# Patient Record
Sex: Female | Born: 1935 | Race: White | Hispanic: No
Health system: Southern US, Community
[De-identification: ages and names within clinical notes are randomized; demographics above are authoritative.]

## PROBLEM LIST (undated history)

## (undated) DIAGNOSIS — E871 Hypo-osmolality and hyponatremia: Secondary | ICD-10-CM

## (undated) DIAGNOSIS — F39 Unspecified mood [affective] disorder: Secondary | ICD-10-CM

## (undated) DIAGNOSIS — I10 Essential (primary) hypertension: Secondary | ICD-10-CM

## (undated) DIAGNOSIS — E785 Hyperlipidemia, unspecified: Secondary | ICD-10-CM

## (undated) DIAGNOSIS — K219 Gastro-esophageal reflux disease without esophagitis: Secondary | ICD-10-CM

## (undated) DIAGNOSIS — G459 Transient cerebral ischemic attack, unspecified: Secondary | ICD-10-CM

## (undated) HISTORY — DX: Gastro-esophageal reflux disease without esophagitis: K21.9

## (undated) HISTORY — DX: Hypo-osmolality and hyponatremia: E87.1

## (undated) HISTORY — DX: Hyperlipidemia, unspecified: E78.5

## (undated) HISTORY — DX: Unspecified mood (affective) disorder: F39

## (undated) HISTORY — PX: ABDOMINAL HYSTERECTOMY: SHX81

---

## 2004-11-02 ENCOUNTER — Ambulatory Visit: Payer: Self-pay | Admitting: Internal Medicine

## 2005-12-18 ENCOUNTER — Ambulatory Visit: Payer: Self-pay | Admitting: Internal Medicine

## 2006-03-19 ENCOUNTER — Emergency Department: Payer: Self-pay | Admitting: Emergency Medicine

## 2006-03-19 ENCOUNTER — Other Ambulatory Visit: Payer: Self-pay

## 2006-12-23 ENCOUNTER — Ambulatory Visit: Payer: Self-pay | Admitting: Internal Medicine

## 2007-12-25 ENCOUNTER — Ambulatory Visit: Payer: Self-pay | Admitting: Internal Medicine

## 2008-12-27 ENCOUNTER — Ambulatory Visit: Payer: Self-pay | Admitting: Internal Medicine

## 2010-01-25 ENCOUNTER — Ambulatory Visit: Payer: Self-pay | Admitting: Internal Medicine

## 2011-01-26 ENCOUNTER — Ambulatory Visit: Payer: Self-pay | Admitting: Internal Medicine

## 2011-07-23 ENCOUNTER — Emergency Department: Payer: Self-pay | Admitting: Internal Medicine

## 2011-09-04 ENCOUNTER — Emergency Department: Payer: Self-pay | Admitting: Emergency Medicine

## 2012-01-28 ENCOUNTER — Ambulatory Visit: Payer: Self-pay | Admitting: Internal Medicine

## 2013-07-12 ENCOUNTER — Emergency Department: Payer: Self-pay | Admitting: Emergency Medicine

## 2013-07-12 LAB — COMPREHENSIVE METABOLIC PANEL
Alkaline Phosphatase: 62 U/L (ref 50–136)
Calcium, Total: 9.2 mg/dL (ref 8.5–10.1)
Chloride: 105 mmol/L (ref 98–107)
Co2: 28 mmol/L (ref 21–32)
EGFR (Non-African Amer.): >60
Glucose: 113 mg/dL — ABNORMAL HIGH (ref 65–99)
Osmolality: 278 (ref 275–301)
Sodium: 138 mmol/L (ref 136–145)

## 2013-07-12 LAB — CBC WITH DIFFERENTIAL/PLATELET
Basophil #: 0.1 x10 3/mm 3
Basophil %: 1 %
Eosinophil #: 0.1 x10 3/mm 3
Eosinophil %: 1.8 %
HCT: 35.3 %
HGB: 12 g/dL
Lymphocyte %: 39.9 %
Lymphs Abs: 3.2 x10 3/mm 3
MCH: 31.3 pg
MCHC: 34.1 g/dL
MCV: 92 fL
Monocyte #: 0.7 "x10 3/mm "
Monocyte %: 9 %
Neutrophil #: 3.9 x10 3/mm 3
Neutrophil %: 48.3 %
Platelet: 250 x10 3/mm 3
RBC: 3.84 X10 6/mm 3
RDW: 12.7 %
WBC: 8 x10 3/mm 3

## 2013-07-12 LAB — PRO B NATRIURETIC PEPTIDE: B-Type Natriuretic Peptide: 193 pg/mL

## 2013-07-12 LAB — TROPONIN I
Troponin-I: <0.02 ng/mL
Troponin-I: <0.02 ng/mL

## 2015-12-28 DIAGNOSIS — H903 Sensorineural hearing loss, bilateral: Secondary | ICD-10-CM | POA: Diagnosis not present

## 2016-01-23 DIAGNOSIS — B351 Tinea unguium: Secondary | ICD-10-CM | POA: Diagnosis not present

## 2016-01-23 DIAGNOSIS — M79675 Pain in left toe(s): Secondary | ICD-10-CM | POA: Diagnosis not present

## 2016-01-23 DIAGNOSIS — M79674 Pain in right toe(s): Secondary | ICD-10-CM | POA: Diagnosis not present

## 2016-03-01 DIAGNOSIS — Z79899 Other long term (current) drug therapy: Secondary | ICD-10-CM | POA: Diagnosis not present

## 2016-06-27 DIAGNOSIS — M79674 Pain in right toe(s): Secondary | ICD-10-CM | POA: Diagnosis not present

## 2016-06-27 DIAGNOSIS — B351 Tinea unguium: Secondary | ICD-10-CM | POA: Diagnosis not present

## 2016-06-27 DIAGNOSIS — M79675 Pain in left toe(s): Secondary | ICD-10-CM | POA: Diagnosis not present

## 2016-09-03 DIAGNOSIS — Z79899 Other long term (current) drug therapy: Secondary | ICD-10-CM | POA: Diagnosis not present

## 2016-09-03 DIAGNOSIS — N183 Chronic kidney disease, stage 3 (moderate): Secondary | ICD-10-CM | POA: Diagnosis not present

## 2016-09-10 DIAGNOSIS — I10 Essential (primary) hypertension: Secondary | ICD-10-CM | POA: Diagnosis not present

## 2016-09-10 DIAGNOSIS — E7800 Pure hypercholesterolemia, unspecified: Secondary | ICD-10-CM | POA: Diagnosis not present

## 2016-09-10 DIAGNOSIS — N183 Chronic kidney disease, stage 3 (moderate): Secondary | ICD-10-CM | POA: Diagnosis not present

## 2016-09-10 DIAGNOSIS — I679 Cerebrovascular disease, unspecified: Secondary | ICD-10-CM | POA: Diagnosis not present

## 2016-10-11 DIAGNOSIS — Z23 Encounter for immunization: Secondary | ICD-10-CM | POA: Diagnosis not present

## 2016-12-06 DIAGNOSIS — M79675 Pain in left toe(s): Secondary | ICD-10-CM | POA: Diagnosis not present

## 2016-12-06 DIAGNOSIS — M79674 Pain in right toe(s): Secondary | ICD-10-CM | POA: Diagnosis not present

## 2016-12-06 DIAGNOSIS — B351 Tinea unguium: Secondary | ICD-10-CM | POA: Diagnosis not present

## 2017-03-04 DIAGNOSIS — N183 Chronic kidney disease, stage 3 (moderate): Secondary | ICD-10-CM | POA: Diagnosis not present

## 2017-03-04 DIAGNOSIS — E7800 Pure hypercholesterolemia, unspecified: Secondary | ICD-10-CM | POA: Diagnosis not present

## 2017-03-11 DIAGNOSIS — L989 Disorder of the skin and subcutaneous tissue, unspecified: Secondary | ICD-10-CM | POA: Diagnosis not present

## 2017-03-11 DIAGNOSIS — Z79899 Other long term (current) drug therapy: Secondary | ICD-10-CM | POA: Diagnosis not present

## 2017-03-11 DIAGNOSIS — Z0000 Encounter for general adult medical examination without abnormal findings: Secondary | ICD-10-CM | POA: Diagnosis not present

## 2017-04-10 DIAGNOSIS — Z85828 Personal history of other malignant neoplasm of skin: Secondary | ICD-10-CM | POA: Diagnosis not present

## 2017-04-10 DIAGNOSIS — L578 Other skin changes due to chronic exposure to nonionizing radiation: Secondary | ICD-10-CM | POA: Diagnosis not present

## 2017-04-10 DIAGNOSIS — C44311 Basal cell carcinoma of skin of nose: Secondary | ICD-10-CM | POA: Diagnosis not present

## 2017-04-10 DIAGNOSIS — R040 Epistaxis: Secondary | ICD-10-CM | POA: Diagnosis not present

## 2017-04-10 DIAGNOSIS — D485 Neoplasm of uncertain behavior of skin: Secondary | ICD-10-CM | POA: Diagnosis not present

## 2017-07-11 DIAGNOSIS — Z85828 Personal history of other malignant neoplasm of skin: Secondary | ICD-10-CM | POA: Diagnosis not present

## 2017-07-11 DIAGNOSIS — L820 Inflamed seborrheic keratosis: Secondary | ICD-10-CM | POA: Diagnosis not present

## 2017-07-11 DIAGNOSIS — L578 Other skin changes due to chronic exposure to nonionizing radiation: Secondary | ICD-10-CM | POA: Diagnosis not present

## 2017-07-11 DIAGNOSIS — L821 Other seborrheic keratosis: Secondary | ICD-10-CM | POA: Diagnosis not present

## 2017-09-11 DIAGNOSIS — R2681 Unsteadiness on feet: Secondary | ICD-10-CM | POA: Diagnosis not present

## 2017-09-11 DIAGNOSIS — R5383 Other fatigue: Secondary | ICD-10-CM | POA: Diagnosis not present

## 2017-09-11 DIAGNOSIS — N3281 Overactive bladder: Secondary | ICD-10-CM | POA: Diagnosis not present

## 2017-09-11 DIAGNOSIS — R600 Localized edema: Secondary | ICD-10-CM | POA: Diagnosis not present

## 2017-09-11 DIAGNOSIS — R531 Weakness: Secondary | ICD-10-CM | POA: Diagnosis not present

## 2017-09-11 DIAGNOSIS — F329 Major depressive disorder, single episode, unspecified: Secondary | ICD-10-CM | POA: Diagnosis not present

## 2017-09-11 DIAGNOSIS — S40021A Contusion of right upper arm, initial encounter: Secondary | ICD-10-CM | POA: Diagnosis not present

## 2017-09-19 DIAGNOSIS — R2681 Unsteadiness on feet: Secondary | ICD-10-CM | POA: Diagnosis not present

## 2017-09-19 DIAGNOSIS — M6281 Muscle weakness (generalized): Secondary | ICD-10-CM | POA: Diagnosis not present

## 2017-09-24 DIAGNOSIS — M6281 Muscle weakness (generalized): Secondary | ICD-10-CM | POA: Diagnosis not present

## 2017-09-24 DIAGNOSIS — R2681 Unsteadiness on feet: Secondary | ICD-10-CM | POA: Diagnosis not present

## 2017-09-26 DIAGNOSIS — M6281 Muscle weakness (generalized): Secondary | ICD-10-CM | POA: Diagnosis not present

## 2017-09-26 DIAGNOSIS — R2681 Unsteadiness on feet: Secondary | ICD-10-CM | POA: Diagnosis not present

## 2017-09-27 DIAGNOSIS — M6281 Muscle weakness (generalized): Secondary | ICD-10-CM | POA: Diagnosis not present

## 2017-09-27 DIAGNOSIS — R2681 Unsteadiness on feet: Secondary | ICD-10-CM | POA: Diagnosis not present

## 2017-10-01 DIAGNOSIS — M6281 Muscle weakness (generalized): Secondary | ICD-10-CM | POA: Diagnosis not present

## 2017-10-01 DIAGNOSIS — R2681 Unsteadiness on feet: Secondary | ICD-10-CM | POA: Diagnosis not present

## 2017-10-03 DIAGNOSIS — R2681 Unsteadiness on feet: Secondary | ICD-10-CM | POA: Diagnosis not present

## 2017-10-03 DIAGNOSIS — M6281 Muscle weakness (generalized): Secondary | ICD-10-CM | POA: Diagnosis not present

## 2017-10-08 DIAGNOSIS — R2681 Unsteadiness on feet: Secondary | ICD-10-CM | POA: Diagnosis not present

## 2017-10-08 DIAGNOSIS — M6281 Muscle weakness (generalized): Secondary | ICD-10-CM | POA: Diagnosis not present

## 2017-10-09 DIAGNOSIS — B351 Tinea unguium: Secondary | ICD-10-CM | POA: Diagnosis not present

## 2017-10-09 DIAGNOSIS — M79675 Pain in left toe(s): Secondary | ICD-10-CM | POA: Diagnosis not present

## 2017-10-09 DIAGNOSIS — M79674 Pain in right toe(s): Secondary | ICD-10-CM | POA: Diagnosis not present

## 2017-10-10 DIAGNOSIS — R2681 Unsteadiness on feet: Secondary | ICD-10-CM | POA: Diagnosis not present

## 2017-10-10 DIAGNOSIS — M6281 Muscle weakness (generalized): Secondary | ICD-10-CM | POA: Diagnosis not present

## 2017-10-13 ENCOUNTER — Inpatient Hospital Stay
Admission: EM | Admit: 2017-10-13 | Discharge: 2017-10-16 | DRG: 645 | Disposition: A | Discharge status: Skilled Nursing Facility | Payer: Medicare Other | Attending: Internal Medicine | Admitting: Internal Medicine

## 2017-10-13 ENCOUNTER — Emergency Department: Payer: Medicare Other

## 2017-10-13 ENCOUNTER — Encounter: Payer: Self-pay | Admitting: Emergency Medicine

## 2017-10-13 DIAGNOSIS — R918 Other nonspecific abnormal finding of lung field: Secondary | ICD-10-CM | POA: Diagnosis not present

## 2017-10-13 DIAGNOSIS — R262 Difficulty in walking, not elsewhere classified: Secondary | ICD-10-CM | POA: Diagnosis not present

## 2017-10-13 DIAGNOSIS — R531 Weakness: Secondary | ICD-10-CM | POA: Diagnosis not present

## 2017-10-13 DIAGNOSIS — R1310 Dysphagia, unspecified: Secondary | ICD-10-CM | POA: Diagnosis present

## 2017-10-13 DIAGNOSIS — I10 Essential (primary) hypertension: Secondary | ICD-10-CM | POA: Diagnosis present

## 2017-10-13 DIAGNOSIS — R32 Unspecified urinary incontinence: Secondary | ICD-10-CM | POA: Diagnosis present

## 2017-10-13 DIAGNOSIS — Z8673 Personal history of transient ischemic attack (TIA), and cerebral infarction without residual deficits: Secondary | ICD-10-CM

## 2017-10-13 DIAGNOSIS — J449 Chronic obstructive pulmonary disease, unspecified: Secondary | ICD-10-CM | POA: Diagnosis not present

## 2017-10-13 DIAGNOSIS — E441 Mild protein-calorie malnutrition: Secondary | ICD-10-CM | POA: Diagnosis not present

## 2017-10-13 DIAGNOSIS — F419 Anxiety disorder, unspecified: Secondary | ICD-10-CM | POA: Diagnosis not present

## 2017-10-13 DIAGNOSIS — E222 Syndrome of inappropriate secretion of antidiuretic hormone: Principal | ICD-10-CM | POA: Diagnosis present

## 2017-10-13 DIAGNOSIS — Z741 Need for assistance with personal care: Secondary | ICD-10-CM | POA: Diagnosis not present

## 2017-10-13 DIAGNOSIS — Z66 Do not resuscitate: Secondary | ICD-10-CM | POA: Diagnosis present

## 2017-10-13 DIAGNOSIS — I6789 Other cerebrovascular disease: Secondary | ICD-10-CM | POA: Diagnosis not present

## 2017-10-13 DIAGNOSIS — F329 Major depressive disorder, single episode, unspecified: Secondary | ICD-10-CM | POA: Diagnosis not present

## 2017-10-13 DIAGNOSIS — Z7902 Long term (current) use of antithrombotics/antiplatelets: Secondary | ICD-10-CM

## 2017-10-13 DIAGNOSIS — M6281 Muscle weakness (generalized): Secondary | ICD-10-CM | POA: Diagnosis not present

## 2017-10-13 DIAGNOSIS — E871 Hypo-osmolality and hyponatremia: Secondary | ICD-10-CM | POA: Diagnosis not present

## 2017-10-13 DIAGNOSIS — R4182 Altered mental status, unspecified: Secondary | ICD-10-CM | POA: Diagnosis not present

## 2017-10-13 DIAGNOSIS — E860 Dehydration: Secondary | ICD-10-CM | POA: Diagnosis present

## 2017-10-13 DIAGNOSIS — Z7401 Bed confinement status: Secondary | ICD-10-CM | POA: Diagnosis not present

## 2017-10-13 HISTORY — DX: Essential (primary) hypertension: I10

## 2017-10-13 HISTORY — DX: Transient cerebral ischemic attack, unspecified: G45.9

## 2017-10-13 LAB — BASIC METABOLIC PANEL
Anion gap: 9 (ref 5–15)
BUN: 13 mg/dL (ref 6–20)
CHLORIDE: 91 mmol/L — AB (ref 101–111)
CO2: 26 mmol/L (ref 22–32)
CREATININE: 0.7 mg/dL (ref 0.44–1.00)
Calcium: 9.4 mg/dL (ref 8.9–10.3)
GFR calc non Af Amer: >60 mL/min (ref >60)
Glucose, Bld: 114 mg/dL — ABNORMAL HIGH (ref 65–99)
Potassium: 3.7 mmol/L (ref 3.5–5.1)
SODIUM: 126 mmol/L — AB (ref 135–145)

## 2017-10-13 LAB — URINALYSIS, COMPLETE (UACMP) WITH MICROSCOPIC
Bacteria, UA: NONE SEEN
Bilirubin Urine: NEGATIVE
GLUCOSE, UA: NEGATIVE mg/dL
Hgb urine dipstick: NEGATIVE
Ketones, ur: 5 mg/dL — AB
Leukocytes, UA: NEGATIVE
Nitrite: NEGATIVE
PH: 7 (ref 5.0–8.0)
Protein, ur: 30 mg/dL — AB
SPECIFIC GRAVITY, URINE: 1.005 (ref 1.005–1.030)
SQUAMOUS EPITHELIAL / LPF: NONE SEEN

## 2017-10-13 LAB — CBC
HCT: 35.6 % (ref 35.0–47.0)
Hemoglobin: 12.7 g/dL (ref 12.0–16.0)
MCH: 32.2 pg (ref 26.0–34.0)
MCHC: 35.6 g/dL (ref 32.0–36.0)
MCV: 90.3 fL (ref 80.0–100.0)
PLATELETS: 285 10*3/uL (ref 150–440)
RBC: 3.95 MIL/uL (ref 3.80–5.20)
RDW: 12.6 % (ref 11.5–14.5)
WBC: 7.1 10*3/uL (ref 3.6–11.0)

## 2017-10-13 LAB — TROPONIN I: Troponin I: <0.03 ng/mL (ref <0.03)

## 2017-10-13 LAB — LACTIC ACID, PLASMA: LACTIC ACID, VENOUS: 1 mmol/L (ref 0.5–1.9)

## 2017-10-13 MED ORDER — SODIUM CHLORIDE 0.9 % IV SOLN
INTRAVENOUS | Status: DC
  Administered 2017-10-13 – 2017-10-15 (×5): via INTRAVENOUS

## 2017-10-13 MED ORDER — CLONAZEPAM 0.5 MG PO TABS
0.2500 mg | ORAL_TABLET | Freq: Every day | ORAL | Status: DC
  Administered 2017-10-13 – 2017-10-15 (×3): 0.25 mg via ORAL
  Filled 2017-10-13 (×3): qty 1

## 2017-10-13 MED ORDER — SODIUM CHLORIDE 0.9 % IV BOLUS (SEPSIS)
500.0000 mL | Freq: Once | INTRAVENOUS | Status: AC
  Administered 2017-10-13: 500 mL via INTRAVENOUS

## 2017-10-13 MED ORDER — CITALOPRAM HYDROBROMIDE 20 MG PO TABS
10.0000 mg | ORAL_TABLET | Freq: Every day | ORAL | Status: DC
  Administered 2017-10-13 – 2017-10-16 (×4): 10 mg via ORAL
  Filled 2017-10-13 (×4): qty 1

## 2017-10-13 MED ORDER — METOPROLOL TARTRATE 25 MG PO TABS
25.0000 mg | ORAL_TABLET | Freq: Two times a day (BID) | ORAL | Status: DC
  Administered 2017-10-13 – 2017-10-16 (×7): 25 mg via ORAL
  Filled 2017-10-13 (×7): qty 1

## 2017-10-13 MED ORDER — LOSARTAN POTASSIUM 25 MG PO TABS
25.0000 mg | ORAL_TABLET | Freq: Every day | ORAL | Status: DC
  Administered 2017-10-13: 10:00:00 25 mg via ORAL
  Filled 2017-10-13: qty 1

## 2017-10-13 MED ORDER — ENOXAPARIN SODIUM 40 MG/0.4ML ~~LOC~~ SOLN
40.0000 mg | SUBCUTANEOUS | Status: DC
  Administered 2017-10-13 – 2017-10-15 (×3): 40 mg via SUBCUTANEOUS
  Filled 2017-10-13 (×3): qty 0.4

## 2017-10-13 MED ORDER — ONDANSETRON HCL 4 MG/2ML IJ SOLN: 4.0000 mg | Freq: Four times a day (QID) | INTRAMUSCULAR | Status: DC | PRN

## 2017-10-13 MED ORDER — CLOPIDOGREL BISULFATE 75 MG PO TABS
75.0000 mg | ORAL_TABLET | Freq: Every day | ORAL | Status: DC
  Administered 2017-10-13 – 2017-10-16 (×4): 75 mg via ORAL
  Filled 2017-10-13 (×4): qty 1

## 2017-10-13 MED ORDER — ACETAMINOPHEN 325 MG PO TABS: 650.0000 mg | ORAL_TABLET | Freq: Four times a day (QID) | ORAL | Status: DC | PRN

## 2017-10-13 MED ORDER — POLYETHYLENE GLYCOL 3350 17 G PO PACK
17.0000 g | PACK | Freq: Every day | ORAL | Status: DC | PRN
  Administered 2017-10-16: 13:00:00 17 g via ORAL
  Filled 2017-10-13: qty 1

## 2017-10-13 MED ORDER — ONDANSETRON HCL 4 MG PO TABS: 4.0000 mg | ORAL_TABLET | Freq: Four times a day (QID) | ORAL | Status: DC | PRN

## 2017-10-13 MED ORDER — VITAMIN D 1000 UNITS PO TABS
1000.0000 [IU] | ORAL_TABLET | Freq: Two times a day (BID) | ORAL | Status: DC
  Administered 2017-10-13 – 2017-10-16 (×7): 1000 [IU] via ORAL
  Filled 2017-10-13 (×12): qty 1

## 2017-10-13 MED ORDER — HYDRALAZINE HCL 20 MG/ML IJ SOLN
10.0000 mg | Freq: Four times a day (QID) | INTRAMUSCULAR | Status: DC | PRN
  Administered 2017-10-15 – 2017-10-16 (×2): 10 mg via INTRAVENOUS
  Filled 2017-10-13 (×3): qty 1

## 2017-10-13 MED ORDER — ACETAMINOPHEN 650 MG RE SUPP: 650.0000 mg | Freq: Four times a day (QID) | RECTAL | Status: DC | PRN

## 2017-10-13 NOTE — Progress Notes (Signed)
Patient has asked several times if she can have something tonight to help her sleep.  On call MD paged and made aware.  Verbal order for 0.25mg  klonopin QHS received.  Clarise Cruz, RN

## 2017-10-13 NOTE — H&P (Signed)
[facility] [place] NAME: Debra Ho    MR#:  [id number]  DATE OF BIRTH:  1915/08/03  DATE OF ADMISSION:  10/13/2017  PRIMARY CARE PHYSICIAN: Derinda Late, MD   REQUESTING/REFERRING PHYSICIAN: 10/13/2017  CHIEF COMPLAINT:  Generalized weakness  HISTORY OF PRESENT ILLNESS:  Debra Ho  is a 83 y.o. female with a known history of hypertension comes from [facility] with increasing weakness for last few days.  Patient states she has had poor appetite.  She denies any nausea vomiting diarrhea fever or chills.  In the emergency room she was found to have sodium of 126 chloride of 91.  She clinically appears dehydrated weak and fatigued.  She is being admitted for further evaluation and management of acute hyponatremia.  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  PAST MEDICAL HISTORY:   Past Medical History:  Diagnosis Date  . Hypertension   . TIA (transient ischemic attack)     PAST SURGICAL HISTOIRY:  History reviewed. No pertinent surgical history.  SOCIAL HISTORY:   Social History  Substance Use Topics  . Smoking status: Passive Smoke Exposure - Never Smoker  . Smokeless tobacco: Never Used  . Alcohol use Not on file    FAMILY HISTORY:  History reviewed. No pertinent family history.  DRUG ALLERGIES:   Allergies  Allergen Reactions  . Penicillins   . Sulfa Antibiotics     REVIEW OF SYSTEMS:  Review of Systems  Constitutional: Negative for chills, fever and weight loss.  HENT: Negative for ear discharge, ear pain and nosebleeds.   Eyes: Negative for blurred vision, pain and discharge.  Respiratory: Negative for sputum production, shortness of breath, wheezing and stridor.   Cardiovascular: Negative for chest pain, palpitations, orthopnea and PND.  Gastrointestinal: Negative for abdominal pain, diarrhea, nausea and vomiting.  Genitourinary: Negative for frequency and  urgency.  Musculoskeletal: Negative for back pain and joint pain.  Neurological: Positive for weakness. Negative for sensory change, speech change and focal weakness.  Psychiatric/Behavioral: Negative for depression and hallucinations. The patient is not nervous/anxious.      MEDICATIONS AT HOME:   Prior to Admission medications   Medication Sig Start Date End Date Taking? Authorizing Provider  citalopram (CELEXA) 10 MG tablet Take 1 tablet by mouth daily. 10/07/17 10/07/18 Yes [provider]  clopidogrel (PLAVIX) 75 MG tablet Take 1 tablet by mouth daily. 09/10/17  Yes [provider]  losartan (COZAAR) 25 MG tablet Take 1 tablet by mouth daily. 09/10/17  Yes [provider]  Cholecalciferol (VITAMIN D3) 1000 units CAPS Take 1 tablet by mouth 2 (two) times daily.    [provider]      VITAL SIGNS:  Blood pressure (!) 209/58, pulse 73, temperature 97.8 F (36.6 C), temperature source Oral, resp. rate (!) 22, height 5\' 1"  (1.549 m), weight 65.8 kg (145 lb), SpO2 96 %.  PHYSICAL EXAMINATION:  GENERAL:  83 y.o.-year-old patient lying in the bed with no acute distress.  EYES: Pupils equal, round, reactive to light and accommodation. No scleral icterus. Extraocular muscles intact.  HEENT: Head atraumatic, normocephalic.  Oral mucosa dry  NECK:  Supple, no jugular venous distention. No thyroid enlargement, no tenderness.  LUNGS: Normal breath sounds bilaterally, no wheezing, rales,rhonchi or crepitation. No use of accessory muscles of respiration.  CARDIOVASCULAR: S1, S2 normal. No murmurs, rubs, or gallops.  ABDOMEN: Soft, nontender, nondistended. Bowel sounds present. No organomegaly or mass.  EXTREMITIES:  No pedal edema, cyanosis, or clubbing.  NEUROLOGIC: Cranial nerves II through XII are intact. Muscle strength 4/5 in all extremities. Sensation intact. Gait not checked.  PSYCHIATRIC: The patient is alert and oriented x 3.  SKIN: No obvious rash,  lesion, or ulcer.   LABORATORY PANEL:   CBC  Recent Labs Lab 10/13/17 0445  WBC 7.1  HGB 12.7  HCT 35.6  PLT 285   ------------------------------------------------------------------------------------------------------------------  Chemistries   Recent Labs Lab 10/13/17 0445  NA 126*  K 3.7  CL 91*  CO2 26  GLUCOSE 114*  BUN 13  CREATININE 0.70  CALCIUM 9.4   ------------------------------------------------------------------------------------------------------------------  Cardiac Enzymes  Recent Labs Lab 10/13/17 0445  TROPONINI <0.03   ------------------------------------------------------------------------------------------------------------------  RADIOLOGY:  Dg Chest 2 View  Result Date: 10/13/2017 CLINICAL DATA:  Acute onset of generalized weakness and loss of appetite. EXAM: CHEST  2 VIEW COMPARISON:  Chest radiograph performed 07/12/2013 FINDINGS: The lungs are hyperexpanded, with flattening of the hemidiaphragms, compatible with COPD. Peribronchial thickening is noted. Minimal bilateral atelectasis or scarring is noted. There is no evidence of pleural effusion or pneumothorax. The heart is mildly enlarged. No acute osseous abnormalities are seen. IMPRESSION: 1. Findings of COPD. Minimal bilateral atelectasis or scarring noted. 2. Mild cardiomegaly. Electronically Signed   By: Garald Balding M.D.   On: 10/13/2017 07:08   Ct Head Wo Contrast  Result Date: 10/13/2017 CLINICAL DATA:  Weakness, decreased appetite for 3 days. EXAM: CT HEAD WITHOUT CONTRAST TECHNIQUE: Contiguous axial images were obtained from the base of the skull through the vertex without intravenous contrast. COMPARISON:  CT HEAD September 04, 2011 FINDINGS: BRAIN: No intraparenchymal hemorrhage, mass effect nor midline shift. The ventricles and sulci are normal for age. Patchy to confluent supratentorial white matter hypodensities. Old RIGHT basal ganglia lacunar infarct though, new from prior  imaging. No acute large vascular territory infarcts. No abnormal extra-axial fluid collections. Basal cisterns are patent. VASCULAR: Moderate calcific atherosclerosis of the carotid siphons. SKULL: No skull fracture. Severe temporomandibular osteoarthrosis. No significant scalp soft tissue swelling. SINUSES/ORBITS: The mastoid air-cells and included paranasal sinuses are well-aerated.The included ocular globes and orbital contents are non-suspicious. Status post bilateral ocular lens implants. OTHER: None. IMPRESSION: 1. No acute intracranial process. 2. Moderate to severe chronic small vessel ischemic disease. Old RIGHT basal ganglia lacunar infarct. Electronically Signed   By: Elon Alas M.D.   On: 10/13/2017 06:55    EKG:    IMPRESSION AND PLAN:   Debra Ho  is a 83 y.o. female with a known history of hypertension comes from [facility] with increasing weakness for last few days.   1 acute hyponatremia with dehydration secondary to poor p.o. Intake IV fluids- -came in with sodium of 126 Patient encouraged to eat  2.  Malignant hypertension -Continue home meds PRN hydralazine-  3.  Generalized weakness -PT to see patient  4.  DVT prophylaxis subcu Lovenox   All the records are reviewed and case discussed with ED provider. Management plans discussed with the patient, family and they are in agreement.  CODE STATUS: DNR--discussed with patient  TOTAL TIME TAKING CARE OF THIS PATIENT: *45* minutes.    Kolden Dupee M.D on 10/13/2017 at 12:18 PM  Between 7am to 6pm - Pager - [phone]  After 6pm go to [email or web address] - password [email or web address] [facility]  [facility]  Office  [phone]  CC: Primary care physician; Derinda Late, MD

## 2017-10-13 NOTE — ED Provider Notes (Signed)
[facility] Emergency Department Provider Note   ____________________________________________   First MD Initiated Contact with Patient 10/13/17 [phone]     (approximate)  I have reviewed the triage vital signs and the nursing notes.   HISTORY  Chief Complaint Weakness    HPI Debra Ho is a 83 y.o. female who comes into the hospital today stating that she has not been doing well for the past 3-4 days.  The patient is independent and reports that she cannot seem to care for herself.  She has been having a hard time walking and she has really bad dry mouth.  She also states that she has been unable to eat anything solid.  She reports that he just has a Vascular throat.  The patient has been trying to push it down but it is very uncomfortable.  She states that she is incontinent but still gets up multiple times nightly and because she cannot walk it makes it very difficult.  The patient denies any fevers, chest pain, dizziness.  She reports that she has had some occasional shortness of breath but nothing significant.  The patient denies abdominal pain but does have some nausea.  She has had no vomiting.  The patient reports that 3 weeks ago she was perfectly fine and able to care for herself but at this point she is unable to do so.  The patient came into the hospital today for further evaluation of her symptoms.   Past Medical History:  Diagnosis Date  . Hypertension   . TIA (transient ischemic attack)     Patient Active Problem List   Diagnosis Date Noted  . Hyponatremia 10/13/2017    History reviewed. No pertinent surgical history.  Prior to Admission medications   Medication Sig Start Date End Date Taking? Authorizing Provider  citalopram (CELEXA) 10 MG tablet Take 1 tablet by mouth daily. 10/07/17 10/07/18 Yes [provider]  clopidogrel (PLAVIX) 75 MG tablet Take 1 tablet by mouth daily. 09/10/17  Yes [provider]    losartan (COZAAR) 25 MG tablet Take 1 tablet by mouth daily. 09/10/17  Yes [provider]  Cholecalciferol (VITAMIN D3) 1000 units CAPS Take 1 tablet by mouth 2 (two) times daily.    [provider]    Allergies Penicillins and Sulfa antibiotics  No family history on file.  Social History Social History  Substance Use Topics  . Smoking status: Passive Smoke Exposure - Never Smoker  . Smokeless tobacco: Never Used  . Alcohol use Not on file    Review of Systems  Constitutional: No fever/chills Eyes: No visual changes. ENT: No sore throat. Cardiovascular: Denies chest pain. Respiratory:  shortness of breath. Gastrointestinal: No abdominal pain.  No nausea, no vomiting.  No diarrhea.  No constipation. Genitourinary: Negative for dysuria. Musculoskeletal: Negative for back pain. Skin: Negative for rash. Neurological: weakness, inability to walk.   ____________________________________________   PHYSICAL EXAM:  VITAL SIGNS: ED Triage Vitals  Enc Vitals Group     BP 10/13/17 0430 (!) 222/81     Pulse Rate 10/13/17 0430 83     Resp 10/13/17 0431 18     Temp 10/13/17 0431 98.1 F (36.7 C)     Temp Source 10/13/17 0431 Oral     SpO2 10/13/17 0430 97 %     Weight 10/13/17 0431 145 lb (65.8 kg)     Height 10/13/17 0431 5\' 1"  (1.549 m)     Head Circumference --  Peak Flow --      Pain Score 10/13/17 0429 8     Pain Loc --      Pain Edu? --      Excl. in [place]? --     Constitutional: Alert and oriented. Well appearing and in mild distress. Eyes: Conjunctivae are normal. PERRL. EOMI. Head: Atraumatic. Nose: No congestion/rhinnorhea. Mouth/Throat: Mucous membranes are moist.  Oropharynx non-erythematous. Cardiovascular: Normal rate, regular rhythm. Grossly normal heart sounds.  Good peripheral circulation. Respiratory: Normal respiratory effort.  No retractions. Lungs CTAB. Gastrointestinal: Soft and nontender. No distention. Positive bowel  sounds Musculoskeletal: No lower extremity tenderness nor edema.   Neurologic:  Halting/ stuttering speech and language.  Cranial nerves II through XII are grossly intact with no focal motor neuro deficits, mild left-sided weakness to extension and flexion.  Bilateral lower extremity weakness about a 4+ out of 5. Skin:  Skin is warm, dry and intact.  Psychiatric: Mood and affect are normal.   ____________________________________________   LABS (all labs ordered are listed, but only abnormal results are displayed)  Labs Reviewed  BASIC METABOLIC PANEL - Abnormal; Notable for the following:       Result Value   Sodium 126 (*)    Chloride 91 (*)    Glucose, Bld 114 (*)    All other components within normal limits  URINALYSIS, COMPLETE (UACMP) WITH MICROSCOPIC - Abnormal; Notable for the following:    Color, Urine STRAW (*)    APPearance CLEAR (*)    Ketones, ur 5 (*)    Protein, ur 30 (*)    All other components within normal limits  CBC  TROPONIN I  LACTIC ACID, PLASMA  CBG MONITORING, ED   ____________________________________________  EKG  ED ECG REPORT I, Loney Hering, the attending physician, personally viewed and interpreted this ECG.   Date: 10/13/2017  EKG Time: 432  Rate: 82  Rhythm: normal sinus rhythm  Axis: left axis deviation  Intervals:none  ST&T Change: flipped t waves in lead I and avL  ____________________________________________  RADIOLOGY  Dg Chest 2 View  Result Date: 10/13/2017 CLINICAL DATA:  Acute onset of generalized weakness and loss of appetite. EXAM: CHEST  2 VIEW COMPARISON:  Chest radiograph performed 07/12/2013 FINDINGS: The lungs are hyperexpanded, with flattening of the hemidiaphragms, compatible with COPD. Peribronchial thickening is noted. Minimal bilateral atelectasis or scarring is noted. There is no evidence of pleural effusion or pneumothorax. The heart is mildly enlarged. No acute osseous abnormalities are seen. IMPRESSION:  1. Findings of COPD. Minimal bilateral atelectasis or scarring noted. 2. Mild cardiomegaly. Electronically Signed   By: Garald Balding M.D.   On: 10/13/2017 07:08   Ct Head Wo Contrast  Result Date: 10/13/2017 CLINICAL DATA:  Weakness, decreased appetite for 3 days. EXAM: CT HEAD WITHOUT CONTRAST TECHNIQUE: Contiguous axial images were obtained from the base of the skull through the vertex without intravenous contrast. COMPARISON:  CT HEAD September 04, 2011 FINDINGS: BRAIN: No intraparenchymal hemorrhage, mass effect nor midline shift. The ventricles and sulci are normal for age. Patchy to confluent supratentorial white matter hypodensities. Old RIGHT basal ganglia lacunar infarct though, new from prior imaging. No acute large vascular territory infarcts. No abnormal extra-axial fluid collections. Basal cisterns are patent. VASCULAR: Moderate calcific atherosclerosis of the carotid siphons. SKULL: No skull fracture. Severe temporomandibular osteoarthrosis. No significant scalp soft tissue swelling. SINUSES/ORBITS: The mastoid air-cells and included paranasal sinuses are well-aerated.The included ocular globes and orbital contents are non-suspicious. Status post bilateral  ocular lens implants. OTHER: None. IMPRESSION: 1. No acute intracranial process. 2. Moderate to severe chronic small vessel ischemic disease. Old RIGHT basal ganglia lacunar infarct. Electronically Signed   By: Elon Alas M.D.   On: 10/13/2017 06:55    ____________________________________________   PROCEDURES  Procedure(s) performed: None  Procedures  Critical Care performed: No  ____________________________________________   INITIAL IMPRESSION / ASSESSMENT AND PLAN / ED COURSE  As part of my medical decision making, I reviewed the following data within the electronic MEDICAL RECORD NUMBER Notes from prior ED visits and [place] Controlled Substance Database   This is 83 year old female who comes into the hospital today  with some weakness and feeling unwell for the past 3 days.  The patient typically lives in independent living and cares for herself.  My differential diagnosis includes urinary tract infection, pneumonia, non-ST segment elevation MI, stroke.  I did send some blood work on the patient and it was found that the patient was hyponatremic with a sodium of 126.  The patient's urinalysis was unremarkable as well as her CBC.  The patient had a lactic acid that was also negative.  We did perform a chest x-ray which did not show any signs of pneumonia and she did have a CT scan.  The patient CT scan showed no acute intracranial process and old right basal ganglia lacunar infarct.  Given the patient's symptoms as well as her elevated blood pressure I will admitted to the hospitalist service for further evaluation and treatment of her symptoms.  It is also possible that the patient's symptoms are blood pressure induced.  She will be admitted to the hospitalist service.      ____________________________________________   FINAL CLINICAL IMPRESSION(S) / ED DIAGNOSES  Final diagnoses:  Weakness  Hyponatremia      NEW MEDICATIONS STARTED DURING THIS VISIT:  New Prescriptions   No medications on file     Note:  This document was prepared using Dragon voice recognition software and may include unintentional dictation errors.    Loney Hering, MD 10/13/17 [phone]

## 2017-10-13 NOTE — ED Notes (Signed)
Pt given apple sauce

## 2017-10-13 NOTE — Progress Notes (Signed)
PT Cancellation Note  Patient Details Name: Debra Ho MRN: [id number] DOB: Dec 01, 1915   Cancelled Treatment:    Reason Eval/Treat Not Completed: Medical issues which prohibited therapy. After performing thorough chart review, patient's BP noted to be quite elevated. Spoke with RN who agreed that it is best to hold mobility assessment at this time and check back next date when medically ready.   Dorice Lamas, PT, DPT 10/13/2017, 11:46 AM

## 2017-10-13 NOTE — ED Notes (Signed)
Talked to pts daughter. Updated on status

## 2017-10-13 NOTE — ED Triage Notes (Addendum)
Patient brought in by ems from [facility]. Patient with complaint of weakness and decrease appetite times 3 days. fsbs by ems 117.

## 2017-10-13 NOTE — Progress Notes (Signed)
Patient's paperwork from [facility] show that patient is a DNR.  Spoke with patient and she confirmed that she is in fact a DNR.  Paged Dr. Posey Pronto and awaiting call back.  Clarise Cruz, RN

## 2017-10-13 NOTE — ED Notes (Signed)
Pt reports decreased appetite and feeling weak for 3 days.

## 2017-10-14 LAB — BASIC METABOLIC PANEL
Anion gap: 4 — ABNORMAL LOW (ref 5–15)
BUN: 12 mg/dL (ref 6–20)
CHLORIDE: 99 mmol/L — AB (ref 101–111)
CO2: 24 mmol/L (ref 22–32)
Calcium: 8.2 mg/dL — ABNORMAL LOW (ref 8.9–10.3)
Creatinine, Ser: 0.78 mg/dL (ref 0.44–1.00)
GFR calc Af Amer: >60 mL/min (ref >60)
Glucose, Bld: 90 mg/dL (ref 65–99)
POTASSIUM: 3.8 mmol/L (ref 3.5–5.1)
SODIUM: 127 mmol/L — AB (ref 135–145)

## 2017-10-14 LAB — SODIUM: SODIUM: 125 mmol/L — AB (ref 135–145)

## 2017-10-14 MED ORDER — LOSARTAN POTASSIUM 50 MG PO TABS
50.0000 mg | ORAL_TABLET | Freq: Every day | ORAL | Status: DC
  Administered 2017-10-14 – 2017-10-16 (×3): 50 mg via ORAL
  Filled 2017-10-14 (×3): qty 1

## 2017-10-14 NOTE — Progress Notes (Signed)
PT Cancellation Note  Patient Details Name: SHARA HARTIS MRN: [id number] DOB: 01-06-1915   Cancelled Treatment:    Reason Eval/Treat Not Completed: Other (comment). Evaluation attempted, however pt eating breakfast and very concerned about her nutrition prior to ambulation. Told patient, I would re-attempt.   Corlene Sabia 10/14/2017, 9:09 AM  Greggory Stallion, PT, DPT [phone]

## 2017-10-14 NOTE — Progress Notes (Signed)
Spoke with son, Gershon Mussel. Concerned for need of skilled nursing facility/ higher level of care preferably  with [facility]

## 2017-10-14 NOTE — Progress Notes (Signed)
[facility] NAME: Debra Ho    MR#:  [id number]  DATE OF BIRTH:  Feb 18, 1915  SUBJECTIVE:   Having issues with swallowing. Wants to get out of bed REVIEW OF SYSTEMS:   Review of Systems  Constitutional: Negative for chills, fever and weight loss.  HENT: Negative for ear discharge, ear pain and nosebleeds.   Eyes: Negative for blurred vision, pain and discharge.  Respiratory: Negative for sputum production, shortness of breath, wheezing and stridor.   Cardiovascular: Negative for chest pain, palpitations, orthopnea and PND.  Gastrointestinal: Negative for abdominal pain, diarrhea, nausea and vomiting.  Genitourinary: Negative for frequency and urgency.  Musculoskeletal: Negative for back pain and joint pain.  Neurological: Positive for weakness. Negative for sensory change, speech change and focal weakness.  Psychiatric/Behavioral: Negative for depression and hallucinations. The patient is not nervous/anxious.    Tolerating Diet:yes Tolerating PT: pending  DRUG ALLERGIES:   Allergies  Allergen Reactions  . Penicillins   . Sulfa Antibiotics     VITALS:  Blood pressure (!) 185/55, pulse 81, temperature 98.9 F (37.2 C), temperature source Oral, resp. rate 20, height 5\' 1"  (1.549 m), weight 65.8 kg (145 lb), SpO2 95 %.  PHYSICAL EXAMINATION:   Physical Exam  GENERAL:  83 y.o.-year-old patient lying in the bed with no acute distress.  EYES: Pupils equal, round, reactive to light and accommodation. No scleral icterus. Extraocular muscles intact.  HEENT: Head atraumatic, normocephalic. Oropharynx and nasopharynx clear.  NECK:  Supple, no jugular venous distention. No thyroid enlargement, no tenderness.  LUNGS: Normal breath sounds bilaterally, no wheezing, rales, rhonchi. No use of accessory muscles of respiration.  CARDIOVASCULAR: S1, S2 normal. No murmurs, rubs, or gallops.  ABDOMEN: Soft, nontender,  nondistended. Bowel sounds present. No organomegaly or mass.  EXTREMITIES: No cyanosis, clubbing or edema b/l.    NEUROLOGIC: Cranial nerves II through XII are intact. No focal Motor or sensory deficits b/l.   PSYCHIATRIC:  patient is alert and oriented x 3.  SKIN: No obvious rash, lesion, or ulcer.   LABORATORY PANEL:  CBC  Recent Labs Lab 10/13/17 0445  WBC 7.1  HGB 12.7  HCT 35.6  PLT 285    Chemistries   Recent Labs Lab 10/14/17 0438  NA 127*  K 3.8  CL 99*  CO2 24  GLUCOSE 90  BUN 12  CREATININE 0.78  CALCIUM 8.2*   Cardiac Enzymes  Recent Labs Lab 10/13/17 0445  TROPONINI <0.03   RADIOLOGY:  Dg Chest 2 View  Result Date: 10/13/2017 CLINICAL DATA:  Acute onset of generalized weakness and loss of appetite. EXAM: CHEST  2 VIEW COMPARISON:  Chest radiograph performed 07/12/2013 FINDINGS: The lungs are hyperexpanded, with flattening of the hemidiaphragms, compatible with COPD. Peribronchial thickening is noted. Minimal bilateral atelectasis or scarring is noted. There is no evidence of pleural effusion or pneumothorax. The heart is mildly enlarged. No acute osseous abnormalities are seen. IMPRESSION: 1. Findings of COPD. Minimal bilateral atelectasis or scarring noted. 2. Mild cardiomegaly. Electronically Signed   By: Garald Balding M.D.   On: 10/13/2017 07:08   Ct Head Wo Contrast  Result Date: 10/13/2017 CLINICAL DATA:  Weakness, decreased appetite for 3 days. EXAM: CT HEAD WITHOUT CONTRAST TECHNIQUE: Contiguous axial images were obtained from the base of the skull through the vertex without intravenous contrast. COMPARISON:  CT HEAD September 04, 2011 FINDINGS: BRAIN: No intraparenchymal hemorrhage, mass effect nor midline shift. The ventricles and sulci  are normal for age. Patchy to confluent supratentorial white matter hypodensities. Old RIGHT basal ganglia lacunar infarct though, new from prior imaging. No acute large vascular territory infarcts. No abnormal  extra-axial fluid collections. Basal cisterns are patent. VASCULAR: Moderate calcific atherosclerosis of the carotid siphons. SKULL: No skull fracture. Severe temporomandibular osteoarthrosis. No significant scalp soft tissue swelling. SINUSES/ORBITS: The mastoid air-cells and included paranasal sinuses are well-aerated.The included ocular globes and orbital contents are non-suspicious. Status post bilateral ocular lens implants. OTHER: None. IMPRESSION: 1. No acute intracranial process. 2. Moderate to severe chronic small vessel ischemic disease. Old RIGHT basal ganglia lacunar infarct. Electronically Signed   By: Elon Alas M.D.   On: 10/13/2017 06:55   ASSESSMENT AND PLAN:  Debra Ho  is a 83 y.o. female with a known history of hypertension comes from [facility] [place] [facility] with increasing weakness for last few days.   1 acute hyponatremia with dehydration secondary to poor p.o. Intake IV fluids- -came in with sodium of 126--127 Patient encouraged to eat  2.  Malignant hypertension -Continue home meds PRN hydralazine- -increased losartan  3.  Generalized weakness -PT to see patient  4.  DVT prophylaxis subcu Lovenox  5. Dysphagia-mild Speech to see  D/w pt's son on the phone  Case discussed with Care Management/Social Worker. Management plans discussed with the patient, family and they are in agreement.  CODE STATUS: dnr DVT Prophylaxis: lovenox  TOTAL TIME TAKING CARE OF THIS PATIENT: *30* minutes.  >50% time spent on counselling and coordination of care  POSSIBLE D/C IN *1-2 DAYS, DEPENDING ON CLINICAL CONDITION.  Note: This dictation was prepared with Dragon dictation along with smaller phrase technology. Any transcriptional errors that result from this process are unintentional.  Beonka Amesquita M.D on 10/14/2017 at 8:39 AM  Between 7am to 6pm - Pager - [phone]  After 6pm go to [email or web address] - password [email or web address] [facility]   Office  [phone]  CC: Primary care physician; Derinda Late, MD

## 2017-10-14 NOTE — Progress Notes (Signed)
Spoke with patient Son and daughter via telephone Helene Kelp and Herbie [place]). Updated on plan of care. Son and Daughter concerned need a higher level of care than currently receiving. Explained physical therapy is schedule to assess patient today.

## 2017-10-14 NOTE — Evaluation (Signed)
Occupational Therapy Evaluation Patient Details Name: Debra Ho MRN: [id number] DOB: October 23, 1915 Today's Date: 10/14/2017    History of Present Illness Pt. is a 83 y.o. female who was admitted to [facility] with hyponatremia. Pt. PMHx includes: HTN, and TIA.   Clinical Impression   Pt. Is a 83 y.o. Female who was admitted to [facility] with hyponatremia. Pt. Presents with weakness, limited activity tolerance, and limited functional mobility. Pt. Resides at [facility] in independent living. Pt. Was very independent up until one month ago, when she started to have a progressive decline requiring assist with ADLs, IADL, and walking. Pt. Has a personal care giver once a day to assist pt. With ADLs/IADLs. Pt. Could benefit from skilled OT services for ADL training, A/E training, functional mobility, and pt. education about energy conservation, work simplification techniques, home modification, and DME. Pt. Could benefit from SNF level of care with follow-up OT services.      Follow Up Recommendations  SNF    Equipment Recommendations       Recommendations for Other Services       Precautions / Restrictions                                                      ADL either performed or assessed with clinical judgement   ADL Overall ADL's : Needs assistance/impaired Eating/Feeding: Set up   Grooming: Set up;Minimal assistance       Lower Body Bathing: Maximal assistance;Moderate assistance;Set up   Upper Body Dressing : Set up;Minimal assistance   Lower Body Dressing: Maximal assistance;Moderate assistance;Set up               Functional mobility during ADLs: Minimal assistance       Vision Patient Visual Report: No change from baseline       Perception     Praxis      Pertinent Vitals/Pain Pain Assessment: 0-10 Pain Score: 0-No pain     Hand Dominance Right   Extremity/Trunk Assessment Upper Extremity Assessment Upper Extremity  Assessment: Generalized weakness           Communication Communication Communication: No difficulties   Cognition Arousal/Alertness: Awake/alert Behavior During Therapy: WFL for tasks assessed/performed Overall Cognitive Status: Within Functional Limits for tasks assessed                                     General Comments       Exercises     Shoulder Instructions      Home Living Family/patient expects to be discharged to:: Private residence ([facility] at [facility])   Available Help at Discharge: Family   Home Access: Level entry     Home Layout: One level     Bathroom Shower/Tub: International aid/development worker Accessibility: Yes   Home Equipment: None          Prior Functioning/Environment Level of Independence: Independent with assistive device(s)        Comments: Pt. has a daily personal caregiver. Pt. was independent up until one month ago. Pt. has been progressively needing more assistance since then.        OT Problem List: Decreased strength;Decreased activity tolerance;Pain;Decreased coordination;Impaired UE functional use  OT Treatment/Interventions: Self-care/ADL training;Therapeutic exercise;Therapeutic activities;Patient/family education;DME and/or AE instruction    OT Goals(Current goals can be found in the care plan section) Acute Rehab OT Goals Patient Stated Goal: To return to [facility] OT Goal Formulation: With patient Potential to Achieve Goals: Good  OT Frequency: Min 1X/week   Barriers to D/C:            Co-evaluation              AM-PAC PT "6 Clicks" Daily Activity     Outcome Measure Help from another person eating meals?: None Help from another person taking care of personal grooming?: A Little Help from another person toileting, which includes using toliet, bedpan, or urinal?: A Lot Help from another person bathing (including washing, rinsing, drying)?: A Lot Help from another person  to put on and taking off regular upper body clothing?: A Little Help from another person to put on and taking off regular lower body clothing?: A Lot 6 Click Score: 16   End of Session    Activity Tolerance: Patient tolerated treatment well Patient left: in bed;with call bell/phone within reach  OT Visit Diagnosis: Muscle weakness (generalized) (M62.81)                Time: 1445-1510 OT Time Calculation (min): 25 min Charges:  OT General Charges $OT Visit: 1 Visit OT Evaluation $OT Eval Low Complexity: 1 Low G-Codes: OT G-codes **NOT FOR INPATIENT CLASS** Functional Limitation: Self care Self Care Current Status (X0940): At least 40 percent but less than 60 percent impaired, limited or restricted Self Care Goal Status (H6808): At least 1 percent but less than 20 percent impaired, limited or restricted   Harrel Carina, MS, OTR/L   Harrel Carina, MS, OTR/L 10/14/2017, 3:45 PM

## 2017-10-14 NOTE — NC FL2 (Signed)
[place] LEVEL OF CARE SCREENING TOOL     IDENTIFICATION  Patient Name: Debra Ho Birthdate: 1915-06-16 Sex: female Admission Date (Current Location): 10/13/2017  [facility] and [place] Number:  Engineering geologist and Address:  [facility], [address], [place], [place] [address]      Provider Number: [id number]  Attending Physician Name and Address:  Fritzi Mandes, MD  Relative Name and Phone Number:       Current Level of Care: Hospital Recommended Level of Care: [place] Prior Approval Number:    Date Approved/Denied:   PASRR Number:    Discharge Plan: SNF    Current Diagnoses: Patient Active Problem List   Diagnosis Date Noted  . Hyponatremia 10/13/2017    Orientation RESPIRATION BLADDER Height & Weight     Self  Normal Incontinent Weight: 145 lb (65.8 kg) Height:  5\' 1"  (154.9 cm)  BEHAVIORAL SYMPTOMS/MOOD NEUROLOGICAL BOWEL NUTRITION STATUS   (none)  (none) Continent Diet (soft)  AMBULATORY STATUS COMMUNICATION OF NEEDS Skin   Extensive Assist Verbally Normal                       Personal Care Assistance Level of Assistance  Bathing, Dressing Bathing Assistance: Maximum assistance   Dressing Assistance: Maximum assistance     Functional Limitations Info   (no issues)          SPECIAL CARE FACTORS FREQUENCY  PT (By licensed PT)                    Contractures Contractures Info: Not present    Additional Factors Info  Code Status, Allergies Code Status Info: pcn's, sulfa abx             Current Medications (10/14/2017):  This is the current hospital active medication list Current Facility-Administered Medications  Medication Dose Route Frequency Provider Last Rate Last Dose  . 0.9 %  sodium chloride infusion   Intravenous Continuous Fritzi Mandes, MD 75 mL/hr at 10/14/17 1330    . acetaminophen (TYLENOL) tablet 650 mg  650 mg Oral Q6H PRN Fritzi Mandes, MD       Or  . acetaminophen (TYLENOL) suppository 650 mg  650 mg Rectal Q6H PRN Fritzi Mandes, MD      . cholecalciferol (VITAMIN D) tablet 1,000 Units  1,000 Units Oral BID Fritzi Mandes, MD   1,000 Units at 10/14/17 0941  . citalopram (CELEXA) tablet 10 mg  10 mg Oral Daily Fritzi Mandes, MD   10 mg at 10/14/17 0942  . clonazePAM (KLONOPIN) tablet 0.25 mg  0.25 mg Oral QHS Salary, Montell D, MD   0.25 mg at 10/13/17 2112  . clopidogrel (PLAVIX) tablet 75 mg  75 mg Oral Daily Fritzi Mandes, MD   75 mg at 10/14/17 0941  . enoxaparin (LOVENOX) injection 40 mg  40 mg Subcutaneous Q24H Fritzi Mandes, MD   40 mg at 10/13/17 2113  . hydrALAZINE (APRESOLINE) injection 10 mg  10 mg Intravenous Q6H PRN Fritzi Mandes, MD      . losartan (COZAAR) tablet 50 mg  50 mg Oral Daily Fritzi Mandes, MD   50 mg at 10/14/17 0942  . metoprolol tartrate (LOPRESSOR) tablet 25 mg  25 mg Oral BID Fritzi Mandes, MD   25 mg at 10/14/17 0941  . ondansetron (ZOFRAN) tablet 4 mg  4 mg Oral Q6H PRN Fritzi Mandes, MD       Or  .  ondansetron (ZOFRAN) injection 4 mg  4 mg Intravenous Q6H PRN Fritzi Mandes, MD      . polyethylene glycol (MIRALAX / GLYCOLAX) packet 17 g  17 g Oral Daily PRN Fritzi Mandes, MD         Discharge Medications: Please see discharge summary for a list of discharge medications.  Relevant Imaging Results:  Relevant Lab Results:   Additional Information ss: [id number]  Shela Leff, LCSW

## 2017-10-14 NOTE — Evaluation (Signed)
Physical Therapy Evaluation Patient Details Name: Debra Ho MRN: [id number] DOB: 05-30-1915 Today's Date: 10/14/2017   History of Present Illness  Pt admitted for hyponatremia. Pt with complaints of weakness. Pt with history of HTN and TIA.  Clinical Impression  Pt is a pleasant 83 year old female who was admitted for hyponatremia. Pt performs bed mobility/transfers with min assist and refuses ambulation at this time. Pt demonstrates deficits with strength/endurance/mobility/balance. Pt very particular and has multiple requests. Explained benefit of SNF to patient and family. Pt not safe to return back to ILF as she is not at baseline level. Would benefit from skilled PT to address above deficits and promote optimal return to PLOF; recommend transition to STR upon discharge from acute hospitalization.       Follow Up Recommendations SNF    Equipment Recommendations  None recommended by PT    Recommendations for Other Services       Precautions / Restrictions Precautions Precautions: Fall Restrictions Weight Bearing Restrictions: No      Mobility  Bed Mobility Overal bed mobility: Needs Assistance Bed Mobility: Supine to Sit     Supine to sit: Min assist     General bed mobility comments: needs assist for sequencing mobility. Once seated at EOB, heavy posterior leaning noted. Able to improve seated balance after a few minutes and progress to supervision  Transfers Overall transfer level: Needs assistance Equipment used: Rolling walker (2 wheeled) Transfers: Sit to/from Stand Sit to Stand: Min assist         General transfer comment: standing with RW and slight post leaning noted. Pt very fearful of movement. Socks and shoes donned prior to standing. Once standing, pt refuses to ambulate or transfer to reclienr  Ambulation/Gait             General Gait Details: refused  Stairs            Wheelchair Mobility    Modified Rankin (Stroke  Patients Only)       Balance Overall balance assessment: Needs assistance Sitting-balance support: Bilateral upper extremity supported Sitting balance-Leahy Scale: Fair     Standing balance support: Bilateral upper extremity supported Standing balance-Leahy Scale: Fair                               Pertinent Vitals/Pain Pain Assessment: No/denies pain    Home Living Family/patient expects to be discharged to::  (ILF)                      Prior Function Level of Independence: Independent with assistive device(s)         Comments: prior to 1 month ago, pt very independent with rollater, past month, has been walking less and needing more assistance. Now has CNA 1hr/day     Hand Dominance        Extremity/Trunk Assessment   Upper Extremity Assessment Upper Extremity Assessment: Generalized weakness (B UE grossly 4/5)    Lower Extremity Assessment Lower Extremity Assessment: Generalized weakness (B LE grossly 3+/5)       Communication   Communication: No difficulties  Cognition Arousal/Alertness: Awake/alert Behavior During Therapy: WFL for tasks assessed/performed Overall Cognitive Status: Within Functional Limits for tasks assessed  General Comments      Exercises Other Exercises Other Exercises: Performed rolling to B sides for diaper donning, also performed supine ther-ex including ankle pumps, quad sets, and SLRs on B LE. 10x each with cues for frequency and duration throughout hospital stay.  Ther-ex written on board.   Assessment/Plan    PT Assessment Patient needs continued PT services  PT Problem List Decreased strength;Decreased activity tolerance;Decreased balance;Decreased mobility;Decreased safety awareness       PT Treatment Interventions Gait training;DME instruction;Therapeutic activities;Therapeutic exercise;Balance training    PT Goals (Current goals can be  found in the Care Plan section)  Acute Rehab PT Goals Patient Stated Goal: to go back to ILF PT Goal Formulation: With patient Time For Goal Achievement: 10/28/17 Potential to Achieve Goals: Good    Frequency Min 2X/week   Barriers to discharge        Co-evaluation               AM-PAC PT "6 Clicks" Daily Activity  Outcome Measure Difficulty turning over in bed (including adjusting bedclothes, sheets and blankets)?: Unable Difficulty moving from lying on back to sitting on the side of the bed? : Unable Difficulty sitting down on and standing up from a chair with arms (e.g., wheelchair, bedside commode, etc,.)?: Unable Help needed moving to and from a bed to chair (including a wheelchair)?: A Little Help needed walking in hospital room?: A Little Help needed climbing 3-5 steps with a railing? : A Lot 6 Click Score: 11    End of Session Equipment Utilized During Treatment: Gait belt Activity Tolerance: Patient tolerated treatment well Patient left: in bed;with bed alarm set;with family/visitor present Nurse Communication: Mobility status PT Visit Diagnosis: Unsteadiness on feet (R26.81);Muscle weakness (generalized) (M62.81);Difficulty in walking, not elsewhere classified (R26.2)    Time: 4166-0630 PT Time Calculation (min) (ACUTE ONLY): 38 min   Charges:   PT Evaluation $PT Eval Low Complexity: 1 Low PT Treatments $Therapeutic Exercise: 23-37 mins   PT G Codes:   PT G-Codes **NOT FOR INPATIENT CLASS** Functional Assessment Tool Used: AM-PAC 6 Clicks Basic Mobility Functional Limitation: Mobility: Walking and moving around Mobility: Walking and Moving Around Current Status (Z6010): At least 60 percent but less than 80 percent impaired, limited or restricted Mobility: Walking and Moving Around Goal Status [phone]): At least 40 percent but less than 60 percent impaired, limited or restricted    Greggory Stallion, PT, DPT [phone]   Zeev Deakins 10/14/2017,  11:32 AM

## 2017-10-14 NOTE — Evaluation (Signed)
Clinical/Bedside Swallow Evaluation Patient Details  Name: Debra Ho MRN: [id number] Date of Birth: [birth year]  Today's Date: 10/14/2017 Time: SLP Start Time (ACUTE ONLY): 1215 SLP Stop Time (ACUTE ONLY): 1300 SLP Time Calculation (min) (ACUTE ONLY): 45 min  Past Medical History:  Past Medical History:  Diagnosis Date  . Hypertension   . TIA (transient ischemic attack)    Past Surgical History: History reviewed. No pertinent surgical history. HPI:  Pt is a 83 y.o. female with a known history of hypertension comes from [facility] independent living with increasing weakness for last few days.  Patient states she has had poor appetite.  She denies any nausea vomiting diarrhea fever or chills.  In the emergency room she was found to have sodium of 126 chloride of 91.  She clinically appears dehydrated weak and fatigued.  She is being admitted for further evaluation and management of acute hyponatremia.  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   Assessment / Plan / Recommendation Clinical Impression  Pt appeared to present w/ adequate oropharyngeal swallow function c/b appropriate oral management and timely swallows. No overt, immediate s/s of aspiration were noted during this evaluation. Pt is at a decreased risk for aspiration while following aspiration precautions. Pt struggled w/ meds given w/ thin liquid last night, however, had no trouble w/ meds given today.  Pt was observed while eating a lunch meal of thin liquids via straw, puree, and soft solids. Pt's oral phase appeared grossly [facility] for all trials given. No decline in vocal quality or respiratory status were noted given all trials. No immediate, overt s/s of aspiration were noted given all trials. Pt was able to feed self and express her wants/needs/concerns. Pt stated that her swallowing seems to be "getting better", however, she "still doesn't have much appetite". ST educated pt on aspiration precautions - small  bites/sips, slow pace, not talking while eating/drinking, and sitting upright - and applesauce w/ meds for easier swallowing.  D/t pt's overall presentation, Recommend Dysphagia level 3 (mech soft) w/ thin liquids for easier mastication and conservation of energy. Recommend aspiration precautions - sitting upright during meals, small bites/sips, reduce environmental distractions during meals. Recommend meds whole w/ applesauce for easier swallowing. Recommend meal set up.  ST services are no longer needed at this time as it appears pt is at her baseline. ST can f/u w/ education if needed while admitted or if any change in status requiring need for diet modification for safer oral intake. NSG to reconsult if decline in status occurs. MD/NSG updated.   SLP Visit Diagnosis: Dysphagia, unspecified (R13.10) (difficulty w/ meds taken w/ water 1x)    Aspiration Risk   (decreased risk following aspiration precautions)    Diet Recommendation  Dysphagia level 3 (mech soft) w/ thin liquids; aspiration precautions.    Medication Administration: Whole meds with puree (if needed)    Other  Recommendations Recommended Consults:  (dietician f/u; TBD) Oral Care Recommendations: Oral care BID;Patient independent with oral care   Follow up Recommendations  (TBD)      Frequency and Duration            Prognosis Prognosis for Safe Diet Advancement: Good      Swallow Study   General Date of Onset: 10/13/17 HPI: Pt is a 83 y.o. female with a known history of hypertension comes from [facility] independent living with increasing weakness for last few days.  Patient states she has had poor appetite.  She denies any nausea  vomiting diarrhea fever or chills.  In the emergency room she was found to have sodium of 126 chloride of 91.  She clinically appears dehydrated weak and fatigued.  She is being admitted for further evaluation and management of acute hyponatremia.  She was also noted to have malignant  hypertension. Type of Study: Bedside Swallow Evaluation Previous Swallow Assessment: none reported Diet Prior to this Study: Dysphagia 3 (soft);Thin liquids Temperature Spikes Noted: No Respiratory Status: Room air History of Recent Intubation: No Behavior/Cognition: Alert;Pleasant mood;Cooperative;Distractible Oral Cavity Assessment: Within Functional Limits Oral Care Completed by SLP: Recent completion by staff Oral Cavity - Dentition: Dentures, top;Dentures, bottom Vision: Functional for self-feeding Self-Feeding Abilities: Able to feed self;Needs assist;Needs set up Patient Positioning: Upright in bed Baseline Vocal Quality: Normal Volitional Cough: Strong Volitional Swallow: Able to elicit    Oral/Motor/Sensory Function Overall Oral Motor/Sensory Function: Within functional limits (for bolus management)   Ice Chips Ice chips: Not tested   Thin Liquid Thin Liquid: Within functional limits (3 trials) Presentation: Straw;Self Fed    Nectar Thick Nectar Thick Liquid: Not tested   Honey Thick Honey Thick Liquid: Not tested   Puree Puree: Within functional limits Presentation: Self Fed;Spoon Other Comments: 6 trials   Solid   GO   Solid: Within functional limits Presentation: Spoon;Self Fed (4 trials)       Carolynn Sayers, SLP-Graduate Student Carolynn Sayers 10/14/2017,1:43 PM   This information has been reviewed and agreed upon by this supervising clinician.  This patient note, response to treatment and overall treatment plan has been reviewed and this clinician agrees with the information provided.  10/15/17, 3:27 PM [place], [place], CCC-SLP

## 2017-10-15 ENCOUNTER — Inpatient Hospital Stay: Payer: Medicare Other

## 2017-10-15 LAB — BASIC METABOLIC PANEL
Anion gap: 7 (ref 5–15)
BUN: 13 mg/dL (ref 6–20)
CALCIUM: 8.2 mg/dL — AB (ref 8.9–10.3)
CHLORIDE: 96 mmol/L — AB (ref 101–111)
CO2: 23 mmol/L (ref 22–32)
CREATININE: 0.64 mg/dL (ref 0.44–1.00)
GFR calc Af Amer: >60 mL/min (ref >60)
GFR calc non Af Amer: >60 mL/min (ref >60)
Glucose, Bld: 104 mg/dL — ABNORMAL HIGH (ref 65–99)
Potassium: 3.8 mmol/L (ref 3.5–5.1)
SODIUM: 126 mmol/L — AB (ref 135–145)

## 2017-10-15 LAB — URIC ACID: Uric Acid, Serum: 3.7 mg/dL (ref 2.3–6.6)

## 2017-10-15 LAB — OSMOLALITY, URINE: Osmolality, Ur: 385 mOsm/kg (ref 300–900)

## 2017-10-15 LAB — TSH: TSH: 2.316 u[IU]/mL (ref 0.350–4.500)

## 2017-10-15 LAB — OSMOLALITY: Osmolality: 265 mOsm/kg — ABNORMAL LOW (ref 275–295)

## 2017-10-15 LAB — SODIUM, URINE, RANDOM: Sodium, Ur: 89 mmol/L

## 2017-10-15 LAB — SODIUM: SODIUM: 132 mmol/L — AB (ref 135–145)

## 2017-10-15 LAB — CORTISOL: CORTISOL PLASMA: 12.7 ug/dL

## 2017-10-15 MED ORDER — SENNOSIDES-DOCUSATE SODIUM 8.6-50 MG PO TABS
1.0000 | ORAL_TABLET | Freq: Every day | ORAL | Status: DC
  Administered 2017-10-15: 20:00:00 1 via ORAL
  Filled 2017-10-15: qty 1

## 2017-10-15 MED ORDER — TOLVAPTAN 15 MG PO TABS
15.0000 mg | ORAL_TABLET | ORAL | Status: DC
  Administered 2017-10-15 – 2017-10-16 (×2): 15 mg via ORAL
  Filled 2017-10-15 (×2): qty 1

## 2017-10-15 NOTE — Progress Notes (Signed)
Speech Therapy Notes: Chart reviewed; NSG consulted about pt's current status. NSG reported pt took bites/sips at meals today w/ no immediate overt s/s of aspiration observed. Recommend continuing current diet w/ aspiration precautions w/ support/monioring at meals.  ST services will f/u next 1-2 days as needed. NSG/MD updated.   Carolynn Sayers, SLP-Graduate Student Gareth Eagle Delfina Redwood 10/15/17; 4:22 PM

## 2017-10-15 NOTE — Clinical Social Work Note (Addendum)
Clinical Social Work Assessment  Patient Details  Name: Debra Ho MRN: [id number] Date of Birth: Apr 20, 1915  Date of referral:  10/15/17               Reason for consult:  Facility Placement, Discharge Planning                Permission sought to share information with:  Chartered certified accountant granted to share information::  Yes, Verbal Permission Granted  Name::      [place]::   [place]  Relationship::     Contact Information:     Housing/Transportation Living arrangements for the past 2 months:  [place] of Information:  Adult [place] Patient Interpreter Needed:  None Criminal Activity/Legal Involvement Pertinent to Current Situation/Hospitalization:  No - Comment as needed Significant Relationships:  Adult Children Lives with:  Facility Resident Do you feel safe going back to the place where you live?  Yes Need for family participation in patient care:  Yes (Comment)  Care giving concerns:  Patient has been a resident at [place] for the past 13 years.   Social Worker assessment / plan:  Holiday representative (CSW) received SNF consult. CSW also noted that patient is a resident at [facility] independent living. Social work Theatre manager met with patient and her son/HPOA Gershon Mussel [phone]) at bedside. Patient was asleep so her son spoke on her behalf. Social work Theatre manager introduced self and explained the role of the [place]. Patient's son Gershon Mussel stated that patient has been a resident at [facility] independent living for the past 13 years. Social work Theatre manager explained the SNF process and that Medicare requires a 3 night qualifying inpatient stay in order to pay for SNF. Patient was admitted on 10/13/17. Patient's son verbalized his understanding. Social work Theatre manager explained that we will arrange EMS for transport to [facility]. FL2 completed. Per  Seth Bake admissions coordinator at [facility] patient can come to SNF section at [facility]. Son is agreeable for patient to go to [facility] section. CSW and social work Theatre manager will continue to follow up and assist.  Employment status:  Retired Forensic scientist:  Medicare PT Recommendations:  [place] / Referral to community resources:  [place]  Patient/Family's Response to care: Patient's son prefers [facility] facility.  Patient/Family's Understanding of and Emotional Response to Diagnosis, Current Treatment, and Prognosis:  Patient's son was pleasant and thanked social work Theatre manager for her assistance.  Emotional Assessment Appearance:  Appears stated age Attitude/Demeanor/Rapport:  Unable to Assess Affect (typically observed):  Unable to Assess Orientation:  Oriented to Self, Oriented to Place, Oriented to  Time, Oriented to Situation Alcohol / Substance use:  Not Applicable Psych involvement (Current and /or in the community):  No (Comment)  Discharge Needs  Concerns to be addressed:  Care Coordination, Discharge Planning Concerns Readmission within the last 30 days:  No Current discharge risk:  Dependent with Mobility Barriers to Discharge:  Continued Medical Work up   Smith Mince, Student-Social Work 10/15/2017, 10:57 AM

## 2017-10-15 NOTE — Plan of Care (Signed)
Problem: Education: Goal: Knowledge of  General Education information/materials will improve Outcome: Progressing Hypertensive during shift, received scheduled PO Metoprolol 25mg , PRN IV Hydralazine 10mg  x1.  Improved at recheck.  Denies pain, no needs overnight.  Bed in low position, bed alarm on.  Call bell within reach, [place].

## 2017-10-15 NOTE — Clinical Social Work Placement (Signed)
   CLINICAL SOCIAL WORK PLACEMENT  NOTE  Date:  10/15/2017  Patient Details  Name: LILJA SOLAND MRN: [id number] Date of Birth: 03-12-1915  Clinical Social Work is seeking post-discharge placement for this patient at the [place] level of care (*CSW will initial, date and re-position this form in  chart as items are completed):  Yes   Patient/family provided with [place] Work Department's list of facilities offering this level of care within the geographic area requested by the patient (or if unable, by the patient's family).  Yes   Patient/family informed of their freedom to choose among providers that offer the needed level of care, that participate in Medicare, Medicaid or managed care program needed by the patient, have an available bed and are willing to accept the patient.  Yes   Patient/family informed of [facility]'s ownership interest in [facility] and [facility], as well as of the fact that they are under no obligation to receive care at these facilities.  PASRR submitted to EDS on 10/14/17     PASRR number received on 10/14/17     Existing PASRR number confirmed on       FL2 transmitted to all facilities in geographic area requested by pt/family on 10/14/17     FL2 transmitted to all facilities within larger geographic area on       Patient informed that his/her managed care company has contracts with or will negotiate with certain facilities, including the following:        Yes   Patient/family informed of bed offers received.  Patient chooses bed at  [facility] )     Physician recommends and patient chooses bed at      Patient to be transferred to   on  .  Patient to be transferred to facility by       Patient family notified on   of transfer.  Name of family member notified:        PHYSICIAN       Additional Comment:    _______________________________________________ Jamine Wingate, Ver[place] Beets,  LCSW 10/15/2017, 12:29 PM

## 2017-10-15 NOTE — Care Management Important Message (Signed)
Important Message  Patient Details  Name: Debra Ho MRN: [id number] Date of Birth: 1915/09/03   Medicare Important Message Given:  Yes    Shelbie Ammons, RN 10/15/2017, 7:54 AM

## 2017-10-15 NOTE — Consult Note (Signed)
[facility] CONSULT NOTE    Date: 10/15/2017                  Patient Name:  Debra Ho  MRN: [id number]  DOB: 04-08-1915  Age / Sex: 83 y.o., female         PCP: Derinda Late, MD                 Service Requesting Consult: Hospitalist                 Reason for Consult: Hyponatremia            History of Present Illness: Patient is a 83 y.o. female with a PMHx of hypertension, history of TIA, who was admitted to [facility] on 10/13/2017 for evaluation of generalized weakness.  The patient is quite hard of hearing but is able to provide some history.  Much of the rest of the history is provided by her son.  The patient's son was recently away for travel.  During that time it appears that the patient had significant decline in status.  She is generally quite weak.  She's also had a poor appetite and has not completed many of her recent meals.  She does not appear to be on any diuretic therapy.  She resides at [facility] independent living.  No reported nausea, vomiting, or diarrhea.  She has been started on IV fluid hydration however despite this serum sodium has dropped a bit.  This would suggest underlying SIADH.  2 very small pulmonary nodules were noted.  These were felt to be a low risk.   Medications: Outpatient medications: Prescriptions Prior to Admission  Medication Sig Dispense Refill Last Dose  . citalopram (CELEXA) 10 MG tablet Take 1 tablet by mouth daily.   Unknown at Unknown  . clopidogrel (PLAVIX) 75 MG tablet Take 1 tablet by mouth daily.   Unknown at Unknown  . losartan (COZAAR) 25 MG tablet Take 1 tablet by mouth daily.   Unknown at Unknown  . Cholecalciferol (VITAMIN D3) 1000 units CAPS Take 1 tablet by mouth 2 (two) times daily.   Unknown at Unknown    Current medications: Current Facility-Administered Medications  Medication Dose Route Frequency Provider Last Rate Last Dose  . 0.9 %  sodium chloride infusion   Intravenous  Continuous Fritzi Mandes, MD 75 mL/hr at 10/15/17 0404    . acetaminophen (TYLENOL) tablet 650 mg  650 mg Oral Q6H PRN Fritzi Mandes, MD       Or  . acetaminophen (TYLENOL) suppository 650 mg  650 mg Rectal Q6H PRN Fritzi Mandes, MD      . cholecalciferol (VITAMIN D) tablet 1,000 Units  1,000 Units Oral BID Fritzi Mandes, MD   1,000 Units at 10/15/17 1057  . citalopram (CELEXA) tablet 10 mg  10 mg Oral Daily Fritzi Mandes, MD   10 mg at 10/15/17 1057  . clonazePAM (KLONOPIN) tablet 0.25 mg  0.25 mg Oral QHS Salary, Montell D, MD   0.25 mg at 10/14/17 2212  . clopidogrel (PLAVIX) tablet 75 mg  75 mg Oral Daily Fritzi Mandes, MD   75 mg at 10/15/17 1057  . enoxaparin (LOVENOX) injection 40 mg  40 mg Subcutaneous Q24H Fritzi Mandes, MD   40 mg at 10/14/17 2212  . hydrALAZINE (APRESOLINE) injection 10 mg  10 mg Intravenous Q6H PRN Fritzi Mandes, MD   10 mg at 10/15/17 8527  . losartan (COZAAR) tablet 50 mg  50  mg Oral Daily Fritzi Mandes, MD   50 mg at 10/15/17 1057  . metoprolol tartrate (LOPRESSOR) tablet 25 mg  25 mg Oral BID Fritzi Mandes, MD   25 mg at 10/15/17 1057  . ondansetron (ZOFRAN) tablet 4 mg  4 mg Oral Q6H PRN Fritzi Mandes, MD       Or  . ondansetron [facility]) injection 4 mg  4 mg Intravenous Q6H PRN Fritzi Mandes, MD      . polyethylene glycol (MIRALAX / GLYCOLAX) packet 17 g  17 g Oral Daily PRN Fritzi Mandes, MD      . tolvaptan [facility]) tablet 15 mg  15 mg Oral Q24H Krystie Leiter, MD   15 mg at 10/15/17 1405      Allergies: Allergies  Allergen Reactions  . Penicillins   . Sulfa Antibiotics       Past Medical History: Past Medical History:  Diagnosis Date  . Hypertension   . TIA (transient ischemic attack)      Past Surgical History: History reviewed. No pertinent surgical history.   Family History: History reviewed. No pertinent family history.   Social History: Social History   Social History  . Marital status: Widowed    Spouse name: N/A  . Number of children: N/A  . Years  of education: N/A   Occupational History  . Not on file.   Social History Main Topics  . Smoking status: Passive Smoke Exposure - Never Smoker  . Smokeless tobacco: Never Used  . Alcohol use Not on file  . Drug use: Unknown  . Sexual activity: Not on file   Other Topics Concern  . Not on file   Social History Narrative  . No narrative on file     Review of Systems: Review of Systems  Constitutional: Positive for malaise/fatigue. Negative for chills, fever and weight loss.  HENT: Positive for hearing loss. Negative for congestion and nosebleeds.   Eyes: Negative for blurred vision and double vision.  Respiratory: Negative for cough, hemoptysis and sputum production.   Cardiovascular: Negative for chest pain, palpitations and orthopnea.  Gastrointestinal: Negative for diarrhea, heartburn, nausea and vomiting.  Genitourinary: Positive for frequency. Negative for dysuria and urgency.  Musculoskeletal: Negative for back pain and myalgias.  Skin: Negative for itching and rash.  Neurological: Positive for weakness. Negative for dizziness, focal weakness and loss of consciousness.  Endo/Heme/Allergies: Negative for polydipsia. Does not bruise/bleed easily.  Psychiatric/Behavioral: Positive for depression.     Vital Signs: Blood pressure (!) 161/49, pulse 68, temperature 98.6 F (37 C), temperature source Oral, resp. rate 18, height 5\' 1"  (1.549 m), weight 65.8 kg (145 lb), SpO2 98 %.  Weight trends: Filed Weights   10/13/17 0431  Weight: 65.8 kg (145 lb)    Physical Exam: General: NAD, resting in bed  Head: Normocephalic, atraumatic.  Eyes: Anicteric, EOMI  Nose: Mucous membranes moist, not inflammed, nonerythematous.  Throat: Oropharynx nonerythematous, no exudate appreciated.   Neck: Supple, trachea midline.  Lungs:  Normal respiratory effort. Clear to auscultation BL without crackles or wheezes.  Heart: RRR. S1 and S2 normal without gallop, murmur, or rubs.   Abdomen:  BS normoactive. Soft, Nondistended, non-tender.  No masses or organomegaly.  Extremities: No pretibial edema.  Neurologic: Awake, quite hard of hearing  Skin: No visible rashes, scars.    Lab results: Basic Metabolic Panel:  Recent Labs Lab 10/13/17 0445 10/14/17 0438 10/14/17 0859 10/15/17 1148  NA 126* 127* 125* 126*  K 3.7 3.8  --  3.8  CL 91* 99*  --  96*  CO2 26 24  --  23  GLUCOSE 114* 90  --  104*  BUN 13 12  --  13  CREATININE 0.70 0.78  --  0.64  CALCIUM 9.4 8.2*  --  8.2*    Liver Function Tests: No results for input(s): AST, ALT, ALKPHOS, BILITOT, PROT, ALBUMIN in the last 168 hours. No results for input(s): LIPASE, AMYLASE in the last 168 hours. No results for input(s): AMMONIA in the last 168 hours.  CBC:  Recent Labs Lab 10/13/17 0445  WBC 7.1  HGB 12.7  HCT 35.6  MCV 90.3  PLT 285    Cardiac Enzymes:  Recent Labs Lab 10/13/17 0445  TROPONINI <0.03    BNP: Invalid input(s): POCBNP  CBG: No results for input(s): GLUCAP in the last 168 hours.  Microbiology: No results found for this or any previous visit.  Coagulation Studies: No results for input(s): LABPROT, INR in the last 72 hours.  Urinalysis:  Recent Labs  10/13/17 0445  COLORURINE STRAW*  LABSPEC 1.005  PHURINE 7.0  GLUCOSEU NEGATIVE  HGBUR NEGATIVE  BILIRUBINUR NEGATIVE  KETONESUR 5*  PROTEINUR 30*  NITRITE NEGATIVE  LEUKOCYTESUR NEGATIVE      Imaging: Dg Chest 1 View  Result Date: 10/15/2017 CLINICAL DATA:  Generalized weakness.  Hypertension.  Hyponatremia. EXAM: CHEST 1 VIEW COMPARISON:  October 13, 2017 FINDINGS: There is mild bibasilar scarring. There is no edema or consolidation. Heart is mildly prominent with pulmonary vascularity within normal limits. No adenopathy. There is aortic atherosclerosis. No bone lesions evident. IMPRESSION: No edema or consolidation. Slight bibasilar scarring. Stable cardiac prominence. There is aortic  atherosclerosis. Aortic Atherosclerosis (ICD10-I70.0). Electronically Signed   By: Lowella Grip III M.D.   On: 10/15/2017 09:25   Ct Chest Wo Contrast  Result Date: 10/15/2017 CLINICAL DATA:  Hyponatremia and weakness EXAM: CT CHEST WITHOUT CONTRAST TECHNIQUE: Multidetector CT imaging of the chest was performed following the standard protocol without IV contrast. COMPARISON:  None. FINDINGS: Cardiovascular: Somewhat limited due to the lack of IV contrast. Aortic calcifications are seen without aneurysmal dilatation. No significant cardiac enlargement is noted. Coronary calcifications are seen as well as calcifications involving the aortic valve and mitral annulus. No pericardial fluid is noted. Mediastinum/Nodes: The thoracic inlet is within normal limits. No significant hilar or mediastinal adenopathy is seen although evaluation is somewhat limited due to the lack of IV contrast. A single precarinal lymph node is noted measuring 7 mm in short axis. The esophagus shows a small sliding-type hiatal hernia. Lungs/Pleura: The lungs are well aerated bilaterally. Mild interstitial thickening is noted bilaterally. Mild bibasilar atelectatic changes are noted slightly worse on the left than the right. A tiny left pleural effusion is seen. A 3-4 mm nodule is noted in the left upper lobe medially best seen on image is bb and 79 of series 3. A 5 mm nodule is noted in the left upper lobe best seen on image number 33 of series 3. No other definitive nodular changes are seen. The tracheobronchial tree is patent but heavily calcified. Upper Abdomen: Nonobstructing right renal stones are seen measuring approximately 2 mm. Musculoskeletal: Degenerative changes of the thoracic spine are noted. No acute bony abnormality is seen. IMPRESSION: Scattered small nodules particularly on the left. No follow-up needed if patient is low-risk (and has no known or suspected primary neoplasm). Non-contrast chest CT can be considered in  12 months if patient is high-risk. This recommendation follows  the consensus statement: Guidelines for Management of Incidental Pulmonary Nodules Detected on CT Images: From the Fleischner Society 2017; Radiology 2017; 284:228-243. Mild interstitial changes likely of a chronic nature. Mild bibasilar atelectatic changes are seen as well as a tiny left pleural effusion. Nonobstructing right renal stones Aortic Atherosclerosis (ICD10-I70.0). Electronically Signed   By: Inez Catalina M.D.   On: 10/15/2017 11:33      Assessment & Plan: Pt is a 83 y.o. female with a PMHx of hypertension, history of TIA, who was admitted to [facility] on 10/13/2017 for evaluation of generalized weakness.    1.  Hyponatremia. 2.  Hypertension.   Plan:  Patient was admitted for generalized weakness.  She was found to be significantly hyponatremic with a serum sodium of 126.  With IV fluid hydration serum sodium dropped to 125.  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  We will start the patient on an ADH antagonist, tolvaptan 15mg  po daily.  If this is successful we will likely transition the patient to salt tablets along with low-dose Lasix as needed. CT chest showed 2 small pulmonary nodules that are felt to be low risk. Otherwise management as per hospitalist.

## 2017-10-15 NOTE — Progress Notes (Signed)
[facility] at [place] NAME: Debra Ho    MR#:  [id number]  DATE OF BIRTH:  1915/11/25  SUBJECTIVE:   Having issues with swallowing. Wants to get out of bed.  son in the room. REVIEW OF SYSTEMS:   Review of Systems  Constitutional: Negative for chills, fever and weight loss.  HENT: Negative for ear discharge, ear pain and nosebleeds.   Eyes: Negative for blurred vision, pain and discharge.  Respiratory: Negative for sputum production, shortness of breath, wheezing and stridor.   Cardiovascular: Negative for chest pain, palpitations, orthopnea and PND.  Gastrointestinal: Negative for abdominal pain, diarrhea, nausea and vomiting.  Genitourinary: Negative for frequency and urgency.  Musculoskeletal: Negative for back pain and joint pain.  Neurological: Positive for weakness. Negative for sensory change, speech change and focal weakness.  Psychiatric/Behavioral: Negative for depression and hallucinations. The patient is not nervous/anxious.    Tolerating Diet:yes Tolerating PT: Rehab  DRUG ALLERGIES:   Allergies  Allergen Reactions  . Penicillins   . Sulfa Antibiotics     VITALS:  Blood pressure (!) 161/49, pulse 68, temperature 98.6 F (37 C), temperature source Oral, resp. rate 18, height 5\' 1"  (1.549 m), weight 65.8 kg (145 lb), SpO2 98 %.  PHYSICAL EXAMINATION:   Physical Exam  GENERAL:  83 y.o.-year-old patient lying in the bed with no acute distress.  EYES: Pupils equal, round, reactive to light and accommodation. No scleral icterus. Extraocular muscles intact.  HEENT: Head atraumatic, normocephalic. Oropharynx and nasopharynx clear.  NECK:  Supple, no jugular venous distention. No thyroid enlargement, no tenderness.  LUNGS: Normal breath sounds bilaterally, no wheezing, rales, rhonchi. No use of accessory muscles of respiration.  CARDIOVASCULAR: S1, S2 normal. No murmurs, rubs, or gallops.  ABDOMEN: Soft,  nontender, nondistended. Bowel sounds present. No organomegaly or mass.  EXTREMITIES: No cyanosis, clubbing or edema b/l.    NEUROLOGIC: Cranial nerves II through XII are intact. No focal Motor or sensory deficits b/l.   PSYCHIATRIC:  patient is alert and oriented x 3.  SKIN: No obvious rash, lesion, or ulcer.   LABORATORY PANEL:  CBC  Recent Labs Lab 10/13/17 0445  WBC 7.1  HGB 12.7  HCT 35.6  PLT 285    Chemistries   Recent Labs Lab 10/15/17 1148  NA 126*  K 3.8  CL 96*  CO2 23  GLUCOSE 104*  BUN 13  CREATININE 0.64  CALCIUM 8.2*   Cardiac Enzymes  Recent Labs Lab 10/13/17 0445  TROPONINI <0.03   RADIOLOGY:  Dg Chest 1 View  Result Date: 10/15/2017 CLINICAL DATA:  Generalized weakness.  Hypertension.  Hyponatremia. EXAM: CHEST 1 VIEW COMPARISON:  October 13, 2017 FINDINGS: There is mild bibasilar scarring. There is no edema or consolidation. Heart is mildly prominent with pulmonary vascularity within normal limits. No adenopathy. There is aortic atherosclerosis. No bone lesions evident. IMPRESSION: No edema or consolidation. Slight bibasilar scarring. Stable cardiac prominence. There is aortic atherosclerosis. Aortic Atherosclerosis (ICD10-I70.0). Electronically Signed   By: Lowella Grip III M.D.   On: 10/15/2017 09:25   Ct Chest Wo Contrast  Result Date: 10/15/2017 CLINICAL DATA:  Hyponatremia and weakness EXAM: CT CHEST WITHOUT CONTRAST TECHNIQUE: Multidetector CT imaging of the chest was performed following the standard protocol without IV contrast. COMPARISON:  None. FINDINGS: Cardiovascular: Somewhat limited due to the lack of IV contrast. Aortic calcifications are seen without aneurysmal dilatation. No significant cardiac enlargement is noted. Coronary calcifications are seen as well  as calcifications involving the aortic valve and mitral annulus. No pericardial fluid is noted. Mediastinum/Nodes: The thoracic inlet is within normal limits. No significant  hilar or mediastinal adenopathy is seen although evaluation is somewhat limited due to the lack of IV contrast. A single precarinal lymph node is noted measuring 7 mm in short axis. The esophagus shows a small sliding-type hiatal hernia. Lungs/Pleura: The lungs are well aerated bilaterally. Mild interstitial thickening is noted bilaterally. Mild bibasilar atelectatic changes are noted slightly worse on the left than the right. A tiny left pleural effusion is seen. A 3-4 mm nodule is noted in the left upper lobe medially best seen on image is bb and 79 of series 3. A 5 mm nodule is noted in the left upper lobe best seen on image number 33 of series 3. No other definitive nodular changes are seen. The tracheobronchial tree is patent but heavily calcified. Upper Abdomen: Nonobstructing right renal stones are seen measuring approximately 2 mm. Musculoskeletal: Degenerative changes of the thoracic spine are noted. No acute bony abnormality is seen. IMPRESSION: Scattered small nodules particularly on the left. No follow-up needed if patient is low-risk (and has no known or suspected primary neoplasm). Non-contrast chest CT can be considered in 12 months if patient is high-risk. This recommendation follows the consensus statement: Guidelines for Management of Incidental Pulmonary Nodules Detected on CT Images: From the Fleischner Society 2017; Radiology 2017; 284:228-243. Mild interstitial changes likely of a chronic nature. Mild bibasilar atelectatic changes are seen as well as a tiny left pleural effusion. Nonobstructing right renal stones Aortic Atherosclerosis (ICD10-I70.0). Electronically Signed   By: Inez Catalina M.D.   On: 10/15/2017 11:33   ASSESSMENT AND PLAN:  Debra Ho  is a 83 y.o. female with a known history of hypertension comes from [facility] with increasing weakness for last few days.   1 acute hyponatremia with dehydration secondary to poor p.o. Intake/?SIADH IV  fluids- -came in with sodium of 126--127--125 Patient encouraged to eat [facility] nephrology.  Spoke with Dr. Zollie Scale recommends CT chest. -CT chest negative for any malignancy. -Tolvaptan 15 mg started  2.  Malignant hypertension -Continue home meds PRN hydralazine- -increased losartan  3.  Generalized weakness -PT - rehab  4.  DVT prophylaxis subcu Lovenox  5. Dysphagia-mild Speech to see  Physical therapy recommends rehab.  Patient will discharge to [facility] when bed available  D/w pt's son on the phone  Case discussed with Care Management/Social Worker. Management plans discussed with the patient, family and they are in agreement.  CODE STATUS: dnr DVT Prophylaxis: lovenox  TOTAL TIME TAKING CARE OF THIS PATIENT: *30* minutes.  >50% time spent on counselling and coordination of care  POSSIBLE D/C IN *1-2 DAYS, DEPENDING ON CLINICAL CONDITION.  Note: This dictation was prepared with Dragon dictation along with smaller phrase technology. Any transcriptional errors that result from this process are unintentional.  Zack Crager M.D on 10/15/2017 at 1:26 PM  Between 7am to 6pm - Pager - [phone]  After 6pm go to [email or web address] - password [email or web address] [facility]  Office  [phone]  CC: Primary care physician; Derinda Late, MD

## 2017-10-15 NOTE — Plan of Care (Signed)
Problem: Physical Regulation: Goal: Ability to maintain clinical measurements within normal limits will improve Outcome: Not Progressing Patient has had poor po intake at meals, ensures given due to poor po intake

## 2017-10-16 DIAGNOSIS — F419 Anxiety disorder, unspecified: Secondary | ICD-10-CM | POA: Diagnosis not present

## 2017-10-16 DIAGNOSIS — E871 Hypo-osmolality and hyponatremia: Secondary | ICD-10-CM | POA: Diagnosis not present

## 2017-10-16 DIAGNOSIS — E222 Syndrome of inappropriate secretion of antidiuretic hormone: Secondary | ICD-10-CM | POA: Diagnosis not present

## 2017-10-16 DIAGNOSIS — E441 Mild protein-calorie malnutrition: Secondary | ICD-10-CM | POA: Diagnosis not present

## 2017-10-16 DIAGNOSIS — I10 Essential (primary) hypertension: Secondary | ICD-10-CM | POA: Diagnosis not present

## 2017-10-16 DIAGNOSIS — Z741 Need for assistance with personal care: Secondary | ICD-10-CM | POA: Diagnosis not present

## 2017-10-16 DIAGNOSIS — Z7401 Bed confinement status: Secondary | ICD-10-CM | POA: Diagnosis not present

## 2017-10-16 DIAGNOSIS — M6281 Muscle weakness (generalized): Secondary | ICD-10-CM | POA: Diagnosis not present

## 2017-10-16 DIAGNOSIS — F39 Unspecified mood [affective] disorder: Secondary | ICD-10-CM | POA: Diagnosis not present

## 2017-10-16 DIAGNOSIS — F329 Major depressive disorder, single episode, unspecified: Secondary | ICD-10-CM | POA: Diagnosis not present

## 2017-10-16 DIAGNOSIS — R531 Weakness: Secondary | ICD-10-CM | POA: Diagnosis not present

## 2017-10-16 DIAGNOSIS — R4182 Altered mental status, unspecified: Secondary | ICD-10-CM | POA: Diagnosis not present

## 2017-10-16 DIAGNOSIS — R262 Difficulty in walking, not elsewhere classified: Secondary | ICD-10-CM | POA: Diagnosis not present

## 2017-10-16 DIAGNOSIS — K219 Gastro-esophageal reflux disease without esophagitis: Secondary | ICD-10-CM | POA: Diagnosis not present

## 2017-10-16 LAB — SODIUM: SODIUM: 133 mmol/L — AB (ref 135–145)

## 2017-10-16 MED ORDER — METOPROLOL TARTRATE 25 MG PO TABS: 25.0000 mg | ORAL_TABLET | Freq: Two times a day (BID) | ORAL | 0 refills | Status: DC

## 2017-10-16 MED ORDER — HYDRALAZINE HCL 25 MG PO TABS: 25.0000 mg | ORAL_TABLET | Freq: Three times a day (TID) | ORAL | 1 refills | Status: DC

## 2017-10-16 MED ORDER — LOSARTAN POTASSIUM 25 MG PO TABS: 50.0000 mg | ORAL_TABLET | Freq: Every day | ORAL | 1 refills | Status: DC

## 2017-10-16 MED ORDER — HYDRALAZINE HCL 50 MG PO TABS
25.0000 mg | ORAL_TABLET | Freq: Three times a day (TID) | ORAL | Status: DC
  Administered 2017-10-16: 13:00:00 50 mg via ORAL
  Filled 2017-10-16: qty 1

## 2017-10-16 NOTE — Discharge Instructions (Signed)
FLUID Restriciton of 1.2 liters/day

## 2017-10-16 NOTE — Progress Notes (Signed)
Patient eating and then will get cleaned up and transported.

## 2017-10-16 NOTE — Clinical Social Work Placement (Signed)
   CLINICAL SOCIAL WORK PLACEMENT  NOTE  Date:  10/16/2017  Patient Details  Name: Debra Ho MRN: [id number] Date of Birth: December 26, 1914  Clinical Social Work is seeking post-discharge placement for this patient at the [place] level of care (*CSW will initial, date and re-position this form in  chart as items are completed):  Yes   Patient/family provided with [place] Work Department's list of facilities offering this level of care within the geographic area requested by the patient (or if unable, by the patient's family).  Yes   Patient/family informed of their freedom to choose among providers that offer the needed level of care, that participate in Medicare, Medicaid or managed care program needed by the patient, have an available bed and are willing to accept the patient.  Yes   Patient/family informed of [facility]'s ownership interest in [facility] and [facility], as well as of the fact that they are under no obligation to receive care at these facilities.  PASRR submitted to EDS on 10/14/17     PASRR number received on 10/14/17     Existing PASRR number confirmed on       FL2 transmitted to all facilities in geographic area requested by pt/family on 10/14/17     FL2 transmitted to all facilities within larger geographic area on       Patient informed that his/her managed care company has contracts with or will negotiate with certain facilities, including the following:        Yes   Patient/family informed of bed offers received.  Patient chooses bed at  [facility] )     Physician recommends and patient chooses bed at      Patient to be transferred to  [facility] ) on 10/16/17.  Patient to be transferred to facility by  [facility] )     Patient family notified on 10/16/17 of transfer.  Name of family member notified:   (Patient's son [name] is aware of D/C today. )     PHYSICIAN        Additional Comment:    _______________________________________________ Levora Werden, Veronia Beets, LCSW 10/16/2017, 8:47 AM

## 2017-10-16 NOTE — Progress Notes (Signed)
EMS called for transport.

## 2017-10-16 NOTE — Progress Notes (Signed)
OT Cancellation Note  Patient Details Name: Debra Ho MRN: [id number] DOB: 04/15/1915   Cancelled Treatment:    Reason Eval/Treat Not Completed: Other (comment). Upon attempt this afternoon, pt preparing for discharge to [facility], EMS called to transport.  Jeni Salles, MPH, MS, OTR/L ascom [phone] 10/16/17, 3:11 PM

## 2017-10-16 NOTE — Progress Notes (Signed)
Report given to [place] at [facility]. All questions answered. Patient POA wants her "cleaned up" prior to transport.

## 2017-10-16 NOTE — Progress Notes (Signed)
POA at bedside requesting results but does not have password.  Patient okayed information to be given. POA also request that patient be "cleaned up" prior to transport to [facility].

## 2017-10-16 NOTE — Plan of Care (Signed)
Problem: Education: Goal: Knowledge of [place] [facility] Education information/materials will improve Outcome: Progressing Hypertensive during shift, received scheduled PO Metoprolol 25mg , PRN IV Hydralazine 10mg  x1.  Improved at recheck.  Denies pain, no needs overnight.  Bed in low position, bed alarm on.  Call bell within reach, [place].

## 2017-10-16 NOTE — Discharge Summary (Signed)
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 Hypertension   . TIA (transient ischemic attack)     HOSPITAL COURSE:   Debra Ho a 83 y.o. femalewith a known history of hypertension comes from [facility] with increasing weakness for last few days.   1 acute hyponatremia with dehydration secondary to poor p.o. Intake/?SIADH IV fluids- -came in with sodium of 126--127--125--Tolvaptan 15 mg x1 --132--133 Patient encouraged to eat [facility] nephrology.  Spoke with Dr. Holley Raring -CT chest negative for any malignancy. 2 pulmonary nodules  2. Malignant hypertension -Continue home meds PRN hydralazine- -increased losartan, added hydralazine  3. Generalized weakness -PT - rehab  4. DVT prophylaxis subcu Lovenox  5. Dysphagia-mild Doing well  Physical therapy recommends rehab.  Patient will discharge to [facility] today.  CONSULTS OBTAINED:  Treatment Team:  Anthonette Legato, MD  DRUG ALLERGIES:   Allergies  Allergen Reactions  . Penicillins   . Sulfa Antibiotics     DISCHARGE MEDICATIONS:   Current Discharge Medication List    START taking these medications   Details  hydrALAZINE (APRESOLINE) 25 MG tablet Take 1 tablet (25 mg total) by mouth every 8 (eight) hours. Qty: 60 tablet, Refills: 1      CONTINUE these medications which have CHANGED   Details  losartan (COZAAR) 25 MG tablet Take 2 tablets (50 mg total) by mouth daily. Qty: 30 tablet, Refills: 1      CONTINUE these medications which  have NOT CHANGED   Details  citalopram (CELEXA) 10 MG tablet Take 1 tablet by mouth daily.    clopidogrel (PLAVIX) 75 MG tablet Take 1 tablet by mouth daily.    Cholecalciferol (VITAMIN D3) 1000 units CAPS Take 1 tablet by mouth 2 (two) times daily.        If you experience worsening of your admission symptoms, develop shortness of breath, life threatening emergency, suicidal or homicidal thoughts you must seek medical attention immediately by calling 911 or calling your MD immediately  if symptoms less severe.  You Must read complete instructions/literature along with all the possible adverse reactions/side effects for all the Medicines you take and that have been prescribed to you. Take any new Medicines after you have completely understood and accept all the possible adverse reactions/side effects.   Please note  You were cared for by a hospitalist during your hospital stay. If you have any questions about your discharge medications or the care you received while you were in the hospital after you are discharged, you can call the unit and asked to speak with the hospitalist on call if the hospitalist that took care of you is not available. Once you are discharged, your primary care physician will handle any further medical issues. Please note that NO REFILLS for any discharge medications will be authorized once you are discharged, as it is imperative that you return to your primary care physician (or establish a relationship with a primary care physician if you do not have one) for your aftercare needs so that they can reassess your need for  medications and monitor your lab values. Today   SUBJECTIVE  Doing well   VITAL SIGNS:  Blood pressure (!) 141/41, pulse 79, temperature 98.6 F (37 C), temperature source Oral, resp. rate 17, height 5\' 1"  (1.549 m), weight 65.8 kg (145 lb), SpO2 97 %.  I/O:    Intake/Output Summary (Last 24 hours) at 10/16/17 0728 Last data filed at 10/16/17  0612  Gross per 24 hour  Intake           4499.5 ml  Output             2925 ml  Net           1574.5 ml    PHYSICAL EXAMINATION:  GENERAL:  83 y.o.-year-old patient lying in the bed with no acute distress.  EYES: Pupils equal, round, reactive to light and accommodation. No scleral icterus. Extraocular muscles intact.  HEENT: Head atraumatic, normocephalic. Oropharynx and nasopharynx clear.  NECK:  Supple, no jugular venous distention. No thyroid enlargement, no tenderness.  LUNGS: Normal breath sounds bilaterally, no wheezing, rales,rhonchi or crepitation. No use of accessory muscles of respiration.  CARDIOVASCULAR: S1, S2 normal. No murmurs, rubs, or gallops.  ABDOMEN: Soft, non-tender, non-distended. Bowel sounds present. No organomegaly or mass.  EXTREMITIES: No pedal edema, cyanosis, or clubbing.  NEUROLOGIC: Cranial nerves II through XII are intact. Muscle strength 5/5 in all extremities. Sensation intact. Gait not checked.  PSYCHIATRIC: The patient is alert and oriented x 3.  SKIN: No obvious rash, lesion, or ulcer.   DATA REVIEW:   CBC   Recent Labs Lab 10/13/17 0445  WBC 7.1  HGB 12.7  HCT 35.6  PLT 285    Chemistries   Recent Labs Lab 10/15/17 1148  10/16/17 0235  NA 126*  < > 133*  K 3.8  --   --   CL 96*  --   --   CO2 23  --   --   GLUCOSE 104*  --   --   BUN 13  --   --   CREATININE 0.64  --   --   CALCIUM 8.2*  --   --   < > = values in this interval not displayed.  Microbiology Results   No results found for this or any previous visit (from the past 240 hour(s)).  RADIOLOGY:  Dg Chest 1 View  Result Date: 10/15/2017 CLINICAL DATA:  Generalized weakness.  Hypertension.  Hyponatremia. EXAM: CHEST 1 VIEW COMPARISON:  October 13, 2017 FINDINGS: There is mild bibasilar scarring. There is no edema or consolidation. Heart is mildly prominent with pulmonary vascularity within normal limits. No adenopathy. There is aortic atherosclerosis. No bone  lesions evident. IMPRESSION: No edema or consolidation. Slight bibasilar scarring. Stable cardiac prominence. There is aortic atherosclerosis. Aortic Atherosclerosis (ICD10-I70.0). Electronically Signed   By: Lowella Grip III M.D.   On: 10/15/2017 09:25   Ct Chest Wo Contrast  Result Date: 10/15/2017 CLINICAL DATA:  Hyponatremia and weakness EXAM: CT CHEST WITHOUT CONTRAST TECHNIQUE: Multidetector CT imaging of the chest was performed following the standard protocol without IV contrast. COMPARISON:  None. FINDINGS: Cardiovascular: Somewhat limited due to the lack of IV contrast. Aortic calcifications are seen without aneurysmal dilatation. No significant cardiac enlargement is noted. Coronary calcifications are seen as well as calcifications involving the aortic valve and mitral annulus. No pericardial fluid is noted. Mediastinum/Nodes: The thoracic inlet is within normal limits. No significant hilar or mediastinal adenopathy is seen although evaluation is somewhat limited due  to the lack of IV contrast. A single precarinal lymph node is noted measuring 7 mm in short axis. The esophagus shows a small sliding-type hiatal hernia. Lungs/Pleura: The lungs are well aerated bilaterally. Mild interstitial thickening is noted bilaterally. Mild bibasilar atelectatic changes are noted slightly worse on the left than the right. A tiny left pleural effusion is seen. A 3-4 mm nodule is noted in the left upper lobe medially best seen on image is bb and 79 of series 3. A 5 mm nodule is noted in the left upper lobe best seen on image number 33 of series 3. No other definitive nodular changes are seen. The tracheobronchial tree is patent but heavily calcified. Upper Abdomen: Nonobstructing right renal stones are seen measuring approximately 2 mm. Musculoskeletal: Degenerative changes of the thoracic spine are noted. No acute bony abnormality is seen. IMPRESSION: Scattered small nodules particularly on the left. No  follow-up needed if patient is low-risk (and has no known or suspected primary neoplasm). Non-contrast chest CT can be considered in 12 months if patient is high-risk. This recommendation follows the consensus statement: Guidelines for Management of Incidental Pulmonary Nodules Detected on CT Images: From the Fleischner Society 2017; Radiology 2017; 284:228-243. Mild interstitial changes likely of a chronic nature. Mild bibasilar atelectatic changes are seen as well as a tiny left pleural effusion. Nonobstructing right renal stones Aortic Atherosclerosis (ICD10-I70.0). Electronically Signed   By: Inez Catalina M.D.   On: 10/15/2017 11:33     Management plans discussed with the patient, family and they are in agreement.  CODE STATUS:     Code Status Orders        Start     Ordered   10/13/17 1127  Do not attempt resuscitation (DNR)  Continuous    Question Answer Comment  In the event of cardiac or respiratory ARREST Do not call a "code blue"   In the event of cardiac or respiratory ARREST Do not perform Intubation, CPR, defibrillation or ACLS   In the event of cardiac or respiratory ARREST Use medication by any route, position, wound care, and other measures to relive pain and suffering. May use oxygen, suction and manual treatment of airway obstruction as needed for comfort.      10/13/17 1126    Code Status History    Date Active Date Inactive Code Status Order ID Comments User Context   10/13/2017  9:18 AM 10/13/2017 11:26 AM Full Code [id number]  Fritzi Mandes, MD ED    Advance Directive Documentation     Most Recent Value  Type of Advance Directive  Healthcare Power of [place], Living will  Pre-existing out of facility DNR order (yellow form or pink MOST form)  -  "MOST" Form in Place?  -      TOTAL TIME TAKING CARE OF THIS PATIENT: *40* minutes.    Sael Furches M.D on 10/16/2017 at 7:28 AM  Between 7am to 6pm - Pager - [phone] After 6pm go to [email or web address] - password [email or web address]  [facility]  Office  [phone]  CC: Primary care physician; Derinda Late, MD

## 2017-10-16 NOTE — Progress Notes (Signed)
Patient is medically stable for D/C to [facility] today. Per Seth Bake admissions coordinator at [facility] patient can come to room 215. RN will call report at [phone] and arrange EMS for transport. Clinical Education officer, museum (CSW) sent D/C orders to [facility] via [facility]. Patient is aware of above. CSW contacted patient's son [name] and made him aware of above. Please reconsult if future social work needs arise. CSW signing off.   [employer], LCSW [phone]

## 2017-10-17 DIAGNOSIS — E871 Hypo-osmolality and hyponatremia: Secondary | ICD-10-CM

## 2017-10-17 DIAGNOSIS — R531 Weakness: Secondary | ICD-10-CM | POA: Diagnosis not present

## 2017-10-17 DIAGNOSIS — F39 Unspecified mood [affective] disorder: Secondary | ICD-10-CM | POA: Diagnosis not present

## 2017-10-17 DIAGNOSIS — E441 Mild protein-calorie malnutrition: Secondary | ICD-10-CM | POA: Diagnosis not present

## 2017-10-17 DIAGNOSIS — K219 Gastro-esophageal reflux disease without esophagitis: Secondary | ICD-10-CM | POA: Diagnosis not present

## 2017-10-17 DIAGNOSIS — I10 Essential (primary) hypertension: Secondary | ICD-10-CM | POA: Diagnosis not present

## 2017-10-17 HISTORY — DX: Hypo-osmolality and hyponatremia: E87.1

## 2017-10-21 ENCOUNTER — Telehealth: Payer: Self-pay

## 2017-10-21 NOTE — Telephone Encounter (Signed)
TEDs ordered today at [facility]

## 2017-10-21 NOTE — Telephone Encounter (Signed)
PLEASE NOTE: All timestamps contained within this report are represented as [place] Standard Time. CONFIDENTIALTY NOTICE: This fax transmission is intended only for the addressee. It contains information that is legally privileged, confidential or otherwise protected from use or disclosure. If you are not the intended recipient, you are strictly prohibited from reviewing, disclosing, copying using or disseminating any of this information or taking any action in reliance on or regarding this information. If you have received this fax in error, please notify us immediately by telephone so that we can arrange for its return to [place]. Phone: [phone], Toll-Free: [phone], Fax: [phone] Page: 1 of 1 Call Id: [id number] [place] Patient Name: Debra Ho Gender: Female DOB: 1915-10-29 Age: 83 Y 57 M 23 D Return Phone Number: [phone] (Primary) Address: City/State/Zip: [place] Client [place] Night - Client Client Site Home Physician Viviana Simpler - MD Contact Type Call Who Is Calling Patient / Member / Family / Caregiver Call Type Triage / Clinical Caller Name Montine Circle Relationship To Patient Daughter Return Phone Number [phone] (Primary) Chief Complaint Feet swelling Reason for Call Symptomatic / Request for [place] states mother in [facility]. States she is having ankle swelling, she used to be in compression socks. A physical therapist thought that might be necessary again, but needs to confirm with the Dr. Quintella Baton she also had pain radiating from her heel overnight. Translation No Nurse Assessment Nurse: Hardin Negus, RN, Danae Chen Date/Time Eilene Ghazi Time): 10/19/2017 1:41:28 PM Confirm and document reason for call. If symptomatic, describe symptoms. ---Caller  states her mother is currently in [facility] which is a rehabilitation/continuous care community center. States her mother was recently in hospital and saw Dr. Silvio Pate. She has questions regarding ankle swelling and compression socks. Advised caller she needs to speak with nurse or charge nurse at the [facility] for this request since her mother is currently in the care of that facility and if needed the nurse can page the physician. Caller understands and will speak with health provider at the facility. Does the patient have any new or worsening symptoms? ---No Guidelines Guideline Title Affirmed Question Affirmed Notes Nurse Date/Time (Eastern Time) Disp. Time Eilene Ghazi Time) Disposition Final User 10/19/2017 1:54:42 PM Clinical Call Yes Hardin Negus, RN, Danae Chen

## 2017-11-11 ENCOUNTER — Telehealth: Payer: Self-pay | Admitting: Internal Medicine

## 2017-11-11 NOTE — Telephone Encounter (Signed)
Copied from [place] #[id number]. Topic: Quick Communication - See Telephone Encounter >> Nov 11, 2017  1:12 PM Hewitt Shorts wrote: CRM for notification. See Telephone encounter for:  Pt daughter is calling stating that Dr. Silvio Pate saw patient in rehab and she is wanting to follow up with him and requesting that meds be refilled vitamin d,  11/11/17.

## 2017-11-11 NOTE — Telephone Encounter (Signed)
Left detailed message on daughter's vm.

## 2017-11-11 NOTE — Telephone Encounter (Signed)
Please call her Dr Baldemar Lenis is her doctor.  I don't have her records--they are at [facility] Her medications should be on her discharge sheet and she should get refills from Dr Baldemar Lenis (though I will refill anything she needs for up to a month if there is any problems getting Rx from him)

## 2017-11-11 NOTE — Telephone Encounter (Signed)
Patient has been released and is at rehab at [facility]. Her daughter Manuela Schwartz [phone] is calling to make sure her medications are up to date and going to the correct pharmacy. [facility] and they are requesting a 90 day supply. The following medications are in question: Vitamin D3 50,000 one monthly, Protonix DR 40 mg 1 at bedtime/dinnertime, Remeron 15 mg  1/2 tab at bedtime, Plavix 75 mg 1 tablet daily ( patient has #70 currently- please send in refill on hold) - the only things she is taking now.  Please call to schedule the next appointment needed.

## 2017-11-12 ENCOUNTER — Telehealth: Payer: Self-pay | Admitting: *Deleted

## 2017-11-12 DIAGNOSIS — M6281 Muscle weakness (generalized): Secondary | ICD-10-CM | POA: Diagnosis not present

## 2017-11-12 DIAGNOSIS — R262 Difficulty in walking, not elsewhere classified: Secondary | ICD-10-CM | POA: Diagnosis not present

## 2017-11-12 DIAGNOSIS — R4189 Other symptoms and signs involving cognitive functions and awareness: Secondary | ICD-10-CM | POA: Diagnosis not present

## 2017-11-12 NOTE — Telephone Encounter (Signed)
I did agree to take her on if needed. She is not a new patient--- I saw her at [facility] Please set her up for a follow up in the next week or so--only needs 15 minutes Let daughter know--would be good for her to come also (patient doesn't drive anyway)

## 2017-11-12 NOTE — Telephone Encounter (Signed)
Is she to take both once daily?

## 2017-11-12 NOTE — Telephone Encounter (Signed)
Okay to send #90 x 3 for both mirtazapine 15mg  and pantoprazole 40

## 2017-11-12 NOTE — Telephone Encounter (Signed)
Copied from [place]. Topic: Appointment Scheduling - Scheduling Inquiry for Clinic >> Nov 12, 2017 12:40 PM Bea Graff, NT wrote: Reason for CRM: Patients daughter-in-law Jackelyn Poling is calling and states that while this pt was being seen in [facility] Dr. Silvio Pate was this pts doctor. She states while she was there that Dr. Silvio Pate did state to pt he could continue to be this pts dr if she chose to be. Patient would like to change care over to Dr. Silvio Pate but he had no new pt slots available until March 2019(per [employer] new pt slots are on Tuesday and Wednesdays at 11:30am). Daughter-in-law would like to see if this pt can be worked in with Dr. Silvio Pate to establish care before then? Please advise. --While on the phone did speak with pt to get verbal permission to discuss pts care with Debbie. There is no DPR on file.

## 2017-11-12 NOTE — Telephone Encounter (Signed)
Patient's appointment is scheduled on 11/20/17.  Patient needs some medications refilled.  She needs Remeron 15 mg dispense as Mirtazapine, Protonix 40 mg dispense as Pantoprazole. Patient uses [facility].

## 2017-11-13 ENCOUNTER — Encounter: Payer: Self-pay | Admitting: Internal Medicine

## 2017-11-13 DIAGNOSIS — K219 Gastro-esophageal reflux disease without esophagitis: Secondary | ICD-10-CM | POA: Insufficient documentation

## 2017-11-13 DIAGNOSIS — F39 Unspecified mood [affective] disorder: Secondary | ICD-10-CM | POA: Insufficient documentation

## 2017-11-13 DIAGNOSIS — M6281 Muscle weakness (generalized): Secondary | ICD-10-CM | POA: Diagnosis not present

## 2017-11-13 DIAGNOSIS — R262 Difficulty in walking, not elsewhere classified: Secondary | ICD-10-CM | POA: Diagnosis not present

## 2017-11-13 DIAGNOSIS — R4189 Other symptoms and signs involving cognitive functions and awareness: Secondary | ICD-10-CM | POA: Diagnosis not present

## 2017-11-13 MED ORDER — PANTOPRAZOLE SODIUM 40 MG PO TBEC: 40.0000 mg | DELAYED_RELEASE_TABLET | Freq: Every day | ORAL | 3 refills | Status: DC

## 2017-11-13 MED ORDER — MIRTAZAPINE 15 MG PO TABS: 15.0000 mg | ORAL_TABLET | Freq: Every day | ORAL | 3 refills | Status: DC

## 2017-11-13 NOTE — Telephone Encounter (Signed)
Rx sent to requested pharmacy

## 2017-11-13 NOTE — Telephone Encounter (Signed)
yes

## 2017-11-14 DIAGNOSIS — M6281 Muscle weakness (generalized): Secondary | ICD-10-CM | POA: Diagnosis not present

## 2017-11-14 DIAGNOSIS — R4189 Other symptoms and signs involving cognitive functions and awareness: Secondary | ICD-10-CM | POA: Diagnosis not present

## 2017-11-14 DIAGNOSIS — R262 Difficulty in walking, not elsewhere classified: Secondary | ICD-10-CM | POA: Diagnosis not present

## 2017-11-18 DIAGNOSIS — R4189 Other symptoms and signs involving cognitive functions and awareness: Secondary | ICD-10-CM | POA: Diagnosis not present

## 2017-11-18 DIAGNOSIS — M6281 Muscle weakness (generalized): Secondary | ICD-10-CM | POA: Diagnosis not present

## 2017-11-20 ENCOUNTER — Ambulatory Visit (INDEPENDENT_AMBULATORY_CARE_PROVIDER_SITE_OTHER): Payer: Medicare Other | Admitting: Internal Medicine

## 2017-11-20 ENCOUNTER — Encounter: Payer: Self-pay | Admitting: Internal Medicine

## 2017-11-20 VITALS — BP 144/82 | HR 78 | Temp 98.2°F | Wt 135.0 lb

## 2017-11-20 DIAGNOSIS — M25511 Pain in right shoulder: Secondary | ICD-10-CM

## 2017-11-20 DIAGNOSIS — G8929 Other chronic pain: Secondary | ICD-10-CM

## 2017-11-20 DIAGNOSIS — I10 Essential (primary) hypertension: Secondary | ICD-10-CM | POA: Insufficient documentation

## 2017-11-20 DIAGNOSIS — E871 Hypo-osmolality and hyponatremia: Secondary | ICD-10-CM

## 2017-11-20 DIAGNOSIS — F39 Unspecified mood [affective] disorder: Secondary | ICD-10-CM

## 2017-11-20 DIAGNOSIS — K219 Gastro-esophageal reflux disease without esophagitis: Secondary | ICD-10-CM

## 2017-11-20 LAB — RENAL FUNCTION PANEL
Albumin: 3.9 g/dL (ref 3.5–5.2)
BUN: 16 mg/dL (ref 6–23)
CHLORIDE: 99 meq/L (ref 96–112)
CO2: 30 meq/L (ref 19–32)
Calcium: 9.4 mg/dL (ref 8.4–10.5)
Creatinine, Ser: 0.86 mg/dL (ref 0.40–1.20)
GFR: 64.43 mL/min (ref >60.00)
Glucose, Bld: 85 mg/dL (ref 70–99)
PHOSPHORUS: 3.4 mg/dL (ref 2.3–4.6)
Potassium: 4.2 mEq/L (ref 3.5–5.1)
SODIUM: 136 meq/L (ref 135–145)

## 2017-11-20 LAB — CBC
HEMATOCRIT: 34.9 % — AB (ref 36.0–46.0)
HEMOGLOBIN: 11.4 g/dL — AB (ref 12.0–15.0)
MCHC: 32.6 g/dL (ref 30.0–36.0)
MCV: 94.7 fl (ref 78.0–100.0)
Platelets: 344 10*3/uL (ref 150.0–400.0)
RBC: 3.69 Mil/uL — AB (ref 3.87–5.11)
RDW: 13.1 % (ref 11.5–15.5)
WBC: 8 10*3/uL (ref 4.0–10.5)

## 2017-11-20 MED ORDER — MIRTAZAPINE 15 MG PO TABS: 7.5000 mg | ORAL_TABLET | Freq: Every day | ORAL | 0 refills | Status: DC

## 2017-11-20 MED ORDER — METHYLPREDNISOLONE ACETATE 40 MG/ML IJ SUSP
40.0000 mg | Freq: Once | INTRAMUSCULAR | Status: AC
  Administered 2017-11-20: 40 mg via INTRA_ARTICULAR

## 2017-11-20 NOTE — Assessment & Plan Note (Signed)
Has been quiet on the PPI

## 2017-11-20 NOTE — Assessment & Plan Note (Addendum)
Severe and limiting her ability to care for herself Apparent rotator cuff tendinopathy and likely chronic tear Discussed trying cortisone injection Discussed pros and cons--got verbal consent  Sterile prep ---posterior approach 2cc 1% plain lidocaine the ethyl chloride 40mg  depomedrol and 5cc  Lidocaine instilled Tolerated well Discussed home care

## 2017-11-20 NOTE — Assessment & Plan Note (Signed)
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

## 2017-11-20 NOTE — Addendum Note (Signed)
Addended by: Modena Nunnery on: 11/20/2017 01:12 PM   Modules accepted: Orders

## 2017-11-20 NOTE — Progress Notes (Signed)
Subjective:    Patient ID: Debra Ho, female    DOB: [birth year], 83 y.o.   MRN: [id number]  HPI Here with DIL for follow up of hospital and rehab stay Went home with aides 2 hours twice a day Hasn't been working out Now up for available AL room--going today to sign up  BP okay off the meds Had been admitted with fatigue and hyponatremia No problems with BP in rehab No chest pain No SOB No dizziness   Mood is okay Happy to be out of health care Looking forward to assisted living  Ongoing right shoulder pain Likely rotator cuff tear in past--can't lift up arm Bad pain though Asks for cortisone shot  Unstable even after the PT Uses rollator to walk  Current Outpatient Medications on File Prior to Visit  Medication Sig Dispense Refill  . clopidogrel (PLAVIX) 75 MG tablet Take 1 tablet by mouth daily.    . pantoprazole (PROTONIX) 40 MG tablet Take 1 tablet (40 mg total) by mouth daily. 90 tablet 3  . Vitamin D, Ergocalciferol, (DRISDOL) 50000 units CAPS capsule Take 50,000 Units by mouth every 30 (thirty) days.     No current facility-administered medications on file prior to visit.     Allergies  Allergen Reactions  . Penicillins   . Sulfa Antibiotics     Past Medical History:  Diagnosis Date  . GERD without esophagitis   . Hyperlipemia   . Hypertension   . Hyponatremia 10/2017  . Mood disorder ([place])   . TIA (transient ischemic attack)     Past Surgical History:  Procedure Laterality Date  . ABDOMINAL HYSTERECTOMY  1950's    No family history on file.  Social History   Socioeconomic History  . Marital status: Widowed    Spouse name: Not on file  . Number of children: 5  . Years of education: Not on file  . Highest education level: Not on file  Social Needs  . Financial resource strain: Not on file  . Food insecurity - worry: Not on file  . Food insecurity - inability: Not on file  . Transportation needs - medical: Not on file  .  Transportation needs - non-medical: Not on file  Occupational History  . Occupation: Customer service manager    Comment: Retired  Tobacco Use  . Smoking status: Passive Smoke Exposure - Never Smoker  . Smokeless tobacco: Never Used  Substance and Sexual Activity  . Alcohol use: No    Frequency: Never  . Drug use: Not on file  . Sexual activity: Not on file  Other Topics Concern  . Not on file  Social History Narrative   Has living will.   Son [name] is health care POA   Has DNR   Would not accept tube feeds   Review of Systems Appetite not great--eating alone Weight may be down since hospital Sleeping okay--other than nocturia    Objective:   Physical Exam  Constitutional: No distress.  Cardiovascular: Normal rate and regular rhythm. Exam reveals no gallop.  Trigeminy Gr 3/6 systolic murmur  Pulmonary/Chest: Effort normal and breath sounds normal. No respiratory distress. She has no wheezes. She has no rales.  Abdominal: Soft.  Musculoskeletal:  Restricted movement of right shoulder in all spheres--especially abduction ( and can't even passively abduct more than 45 degrees due to pain)  Psychiatric: She has a normal mood and affect. Her behavior is normal.          Assessment & Plan:

## 2017-11-20 NOTE — Assessment & Plan Note (Signed)
Hopefully better Will recheck labs

## 2017-11-20 NOTE — Assessment & Plan Note (Signed)
BP Readings from Last 3 Encounters:  11/20/17 (!) 144/82  10/16/17 (!) 148/42   Admitted with fatigue and hyponatremia that is probably related to her Rx Probably better off without any meds

## 2017-11-26 ENCOUNTER — Ambulatory Visit: Payer: Medicare Other | Admitting: Internal Medicine

## 2017-11-26 DIAGNOSIS — R4189 Other symptoms and signs involving cognitive functions and awareness: Secondary | ICD-10-CM | POA: Diagnosis not present

## 2017-11-26 DIAGNOSIS — M6281 Muscle weakness (generalized): Secondary | ICD-10-CM | POA: Diagnosis not present

## 2017-11-28 DIAGNOSIS — M6281 Muscle weakness (generalized): Secondary | ICD-10-CM | POA: Diagnosis not present

## 2017-11-28 DIAGNOSIS — R4189 Other symptoms and signs involving cognitive functions and awareness: Secondary | ICD-10-CM | POA: Diagnosis not present

## 2017-11-29 DIAGNOSIS — M6281 Muscle weakness (generalized): Secondary | ICD-10-CM | POA: Diagnosis not present

## 2017-11-29 DIAGNOSIS — R4189 Other symptoms and signs involving cognitive functions and awareness: Secondary | ICD-10-CM | POA: Diagnosis not present

## 2017-12-02 DIAGNOSIS — R4189 Other symptoms and signs involving cognitive functions and awareness: Secondary | ICD-10-CM | POA: Diagnosis not present

## 2017-12-02 DIAGNOSIS — M6281 Muscle weakness (generalized): Secondary | ICD-10-CM | POA: Diagnosis not present

## 2017-12-03 DIAGNOSIS — M6281 Muscle weakness (generalized): Secondary | ICD-10-CM | POA: Diagnosis not present

## 2017-12-03 DIAGNOSIS — R4189 Other symptoms and signs involving cognitive functions and awareness: Secondary | ICD-10-CM | POA: Diagnosis not present

## 2017-12-05 DIAGNOSIS — M6281 Muscle weakness (generalized): Secondary | ICD-10-CM | POA: Diagnosis not present

## 2017-12-05 DIAGNOSIS — R4189 Other symptoms and signs involving cognitive functions and awareness: Secondary | ICD-10-CM | POA: Diagnosis not present

## 2017-12-06 DIAGNOSIS — M6281 Muscle weakness (generalized): Secondary | ICD-10-CM | POA: Diagnosis not present

## 2017-12-06 DIAGNOSIS — R4189 Other symptoms and signs involving cognitive functions and awareness: Secondary | ICD-10-CM | POA: Diagnosis not present

## 2017-12-13 DIAGNOSIS — M6281 Muscle weakness (generalized): Secondary | ICD-10-CM | POA: Diagnosis not present

## 2017-12-13 DIAGNOSIS — R4189 Other symptoms and signs involving cognitive functions and awareness: Secondary | ICD-10-CM | POA: Diagnosis not present

## 2017-12-16 ENCOUNTER — Other Ambulatory Visit: Payer: Self-pay | Admitting: Internal Medicine

## 2017-12-16 DIAGNOSIS — R4189 Other symptoms and signs involving cognitive functions and awareness: Secondary | ICD-10-CM | POA: Diagnosis not present

## 2017-12-16 DIAGNOSIS — M6281 Muscle weakness (generalized): Secondary | ICD-10-CM | POA: Diagnosis not present

## 2017-12-18 DIAGNOSIS — M6281 Muscle weakness (generalized): Secondary | ICD-10-CM | POA: Diagnosis not present

## 2017-12-18 DIAGNOSIS — R4189 Other symptoms and signs involving cognitive functions and awareness: Secondary | ICD-10-CM | POA: Diagnosis not present

## 2017-12-20 DIAGNOSIS — M6281 Muscle weakness (generalized): Secondary | ICD-10-CM | POA: Diagnosis not present

## 2017-12-20 DIAGNOSIS — R4189 Other symptoms and signs involving cognitive functions and awareness: Secondary | ICD-10-CM | POA: Diagnosis not present

## 2017-12-23 DIAGNOSIS — R4189 Other symptoms and signs involving cognitive functions and awareness: Secondary | ICD-10-CM | POA: Diagnosis not present

## 2017-12-23 DIAGNOSIS — M6281 Muscle weakness (generalized): Secondary | ICD-10-CM | POA: Diagnosis not present

## 2018-01-06 DIAGNOSIS — M6281 Muscle weakness (generalized): Secondary | ICD-10-CM | POA: Diagnosis not present

## 2018-01-06 DIAGNOSIS — R4189 Other symptoms and signs involving cognitive functions and awareness: Secondary | ICD-10-CM | POA: Diagnosis not present

## 2018-01-16 ENCOUNTER — Encounter: Payer: Self-pay | Admitting: Internal Medicine

## 2018-01-16 ENCOUNTER — Ambulatory Visit: Payer: Medicare Other | Admitting: Internal Medicine

## 2018-01-16 VITALS — BP 138/68 | HR 76 | Temp 97.7°F | Resp 24 | Wt 137.0 lb

## 2018-01-16 DIAGNOSIS — M25511 Pain in right shoulder: Secondary | ICD-10-CM | POA: Diagnosis not present

## 2018-01-16 DIAGNOSIS — F39 Unspecified mood [affective] disorder: Secondary | ICD-10-CM

## 2018-01-16 DIAGNOSIS — K219 Gastro-esophageal reflux disease without esophagitis: Secondary | ICD-10-CM | POA: Diagnosis not present

## 2018-01-16 DIAGNOSIS — R011 Cardiac murmur, unspecified: Secondary | ICD-10-CM | POA: Diagnosis not present

## 2018-01-16 DIAGNOSIS — I10 Essential (primary) hypertension: Secondary | ICD-10-CM

## 2018-01-16 DIAGNOSIS — G8929 Other chronic pain: Secondary | ICD-10-CM

## 2018-01-16 DIAGNOSIS — Z8673 Personal history of transient ischemic attack (TIA), and cerebral infarction without residual deficits: Secondary | ICD-10-CM | POA: Insufficient documentation

## 2018-01-16 NOTE — Progress Notes (Signed)
Subjective:    Patient ID: Debra Ho, female    DOB: 1915-07-24, 84 y.o.   MRN: [id number]  HPI First visit in [facility] Reviewed status with Luellen Pucker RN  Has both accepted and adjusted to being here Satisfied with the care  Has assistance for dressing and bathing Both morning and evening aide time Generally independent with bathroom----chronic urinary incontinence Does her own medications Nocturia x 3 usually  Mood has been good Some dysthymia mostly with functional decline Doing better now Mirtazapine helps sleep (on x 2 months or so)  Very little heartburn on the medication Careful about eating  No dysphagia  Current Outpatient Medications on File Prior to Visit  Medication Sig Dispense Refill  . clopidogrel (PLAVIX) 75 MG tablet TAKE 1 TABLET BY MOUTH DAILY 90 tablet 3  . mirtazapine (REMERON) 15 MG tablet Take 0.5 tablets (7.5 mg total) by mouth at bedtime. 1 tablet 0  . pantoprazole (PROTONIX) 40 MG tablet Take 1 tablet (40 mg total) by mouth daily. 90 tablet 3  . Vitamin D, Ergocalciferol, (DRISDOL) 50000 units CAPS capsule Take 50,000 Units by mouth every 30 (thirty) days.     No current facility-administered medications on file prior to visit.     Allergies  Allergen Reactions  . Penicillins   . Sulfa Antibiotics     Past Medical History:  Diagnosis Date  . GERD without esophagitis   . Hyperlipemia   . Hypertension   . Hyponatremia 10/2017  . Mood disorder ([place])   . TIA (transient ischemic attack)     Past Surgical History:  Procedure Laterality Date  . ABDOMINAL HYSTERECTOMY  1950's    History reviewed. No pertinent family history.  Social History   Socioeconomic History  . Marital status: Widowed    Spouse name: Not on file  . Number of children: 5  . Years of education: Not on file  . Highest education level: Not on file  Social Needs  . Financial resource strain: Not on file  . Food insecurity - worry: Not on  file  . Food insecurity - inability: Not on file  . Transportation needs - medical: Not on file  . Transportation needs - non-medical: Not on file  Occupational History  . Occupation: Customer service manager    Comment: Retired  Tobacco Use  . Smoking status: Passive Smoke Exposure - Never Smoker  . Smokeless tobacco: Never Used  Substance and Sexual Activity  . Alcohol use: No    Frequency: Never  . Drug use: Not on file  . Sexual activity: Not on file  Other Topics Concern  . Not on file  Social History Narrative   Has living will.   Son [name] is health care POA   Has DNR   Would not accept tube feeds   Review of Systems Appetite is good Weight is stable Walks with rollator Loves the activities---at least once a week for the recreational ones. Not really exercising Has cough only if she drinks iced beverages Bowels are moving okay with OTC med--docusate Some shoulder pain--just uses tylenol prn    Objective:   Physical Exam  Constitutional: She appears well-developed. No distress.  Neck: No thyromegaly present.  Cardiovascular: Normal rate and regular rhythm. Exam reveals no gallop.  Gr 3/6 systolic murmur  Pulmonary/Chest: Breath sounds normal. No respiratory distress. She has no wheezes. She has no rales.  Abdominal: Soft. There is no tenderness.  Musculoskeletal: She exhibits no edema.  Support stockings on  Lymphadenopathy:    She has no cervical adenopathy.  Skin: No rash noted.  Psychiatric: She has a normal mood and affect. Her behavior is normal.          Assessment & Plan:

## 2018-01-16 NOTE — Assessment & Plan Note (Signed)
Quiet on the PPI 

## 2018-01-16 NOTE — Assessment & Plan Note (Signed)
Mild dysthymia Citalopram in past Mirtazapine helps for sleep also---will continue for now

## 2018-01-16 NOTE — Assessment & Plan Note (Signed)
Sounds like MR No CHF No action on this

## 2018-01-16 NOTE — Assessment & Plan Note (Signed)
Will continue the plavix

## 2018-01-16 NOTE — Assessment & Plan Note (Signed)
Doing okay without meds

## 2018-01-16 NOTE — Assessment & Plan Note (Signed)
Does okay with the tylenol prn

## 2018-02-07 ENCOUNTER — Ambulatory Visit: Payer: Medicare Other | Admitting: Internal Medicine

## 2018-02-07 VITALS — BP 162/74 | HR 70 | Temp 98.1°F | Resp 24 | Wt 142.0 lb

## 2018-02-07 DIAGNOSIS — L84 Corns and callosities: Secondary | ICD-10-CM | POA: Diagnosis not present

## 2018-02-21 ENCOUNTER — Encounter: Payer: Self-pay | Admitting: Internal Medicine

## 2018-02-21 NOTE — Patient Instructions (Signed)
Corns and Calluses Corns are small areas of thickened skin that occur on the top, sides, or tip of a toe. They contain a cone-shaped core with a point that can press on a nerve below. This causes pain. Calluses are areas of thickened skin that can occur anywhere on the body including hands, fingers, palms, soles of the feet, and heels.Calluses are usually larger than corns. What are the causes? Corns and calluses are caused by rubbing (friction) or pressure, such as from shoes that are too tight or do not fit properly. What increases the risk? Corns are more likely to develop in people who have toe deformities, such as hammer toes. Since calluses can occur with friction to any area of the skin, calluses are more likely to develop in people who:  Work with their hands.  Wear shoes that fit poorly, shoes that are too tight, or shoes that are high-heeled.  Have toes deformities.  What are the signs or symptoms? Symptoms of a corn or callus include:  A hard growth on the skin.  Pain or tenderness under the skin.  Redness and swelling.  Increased discomfort while wearing tight-fitting shoes.  How is this diagnosed? Corns and calluses may be diagnosed with a medical history and physical exam. How is this treated? Corns and calluses may be treated with:  Removing the cause of the friction or pressure. This may include: ? Changing your shoes. ? Wearing shoe inserts (orthotics) or other protective layers in your shoes, such as a corn pad. ? Wearing gloves.  Medicines to help soften skin in the hardened, thickened areas.  Reducing the size of the corn or callus by removing the dead layers of skin.  Antibiotic medicines to treat infection.  Surgery, if a toe deformity is the cause.  Follow these instructions at home:  Take medicines only as directed by your health care provider.  If you were prescribed an antibiotic, finish all of it even if you start to feel better.  Wear  shoes that fit well. Avoid wearing high-heeled shoes and shoes that are too tight or too loose.  Wear any padding, protective layers, gloves, or orthotics as directed by your health care provider.  Soak your hands or feet and then use a file or pumice stone to soften your corn or callus. Do this as directed by your health care provider.  Check your corn or callus every day for signs of infection. Watch for: ? Redness, swelling, or pain. ? Fluid, blood, or pus. Contact a health care provider if:  Your symptoms do not improve with treatment.  You have increased redness, swelling, or pain at the site of your corn or callus.  You have fluid, blood, or pus coming from your corn or callus.  You have new symptoms. This information is not intended to replace advice given to you by your health care provider. Make sure you discuss any questions you have with your health care provider. Document Released: 09/08/2004 Document Revised: 06/22/2016 Document Reviewed: 11/29/2014 Elsevier Interactive Patient Education  2018 Elsevier Inc.  

## 2018-02-21 NOTE — Progress Notes (Signed)
Subjective:    Patient ID: Debra Ho, female    DOB: [birth year], 84 y.o.   MRN: [id number]  HPI  Asked to see resident  C/o a corn on her foot, painful with ambulation  Review of Systems  Past Medical History:  Diagnosis Date  . GERD without esophagitis   . Hyperlipemia   . Hypertension   . Hyponatremia 10/2017  . Mood disorder ([place])   . TIA (transient ischemic attack)     Current Outpatient Medications  Medication Sig Dispense Refill  . clopidogrel (PLAVIX) 75 MG tablet TAKE 1 TABLET BY MOUTH DAILY 90 tablet 3  . mirtazapine (REMERON) 15 MG tablet Take 0.5 tablets (7.5 mg total) by mouth at bedtime. 1 tablet 0  . pantoprazole (PROTONIX) 40 MG tablet Take 1 tablet (40 mg total) by mouth daily. 90 tablet 3  . Vitamin D, Ergocalciferol, (DRISDOL) 50000 units CAPS capsule Take 50,000 Units by mouth every 30 (thirty) days.     No current facility-administered medications for this visit.     Allergies  Allergen Reactions  . Penicillins   . Sulfa Antibiotics     History reviewed. No pertinent family history.  Social History   Socioeconomic History  . Marital status: Widowed    Spouse name: Not on file  . Number of children: 5  . Years of education: Not on file  . Highest education level: Not on file  Social Needs  . Financial resource strain: Not on file  . Food insecurity - worry: Not on file  . Food insecurity - inability: Not on file  . Transportation needs - medical: Not on file  . Transportation needs - non-medical: Not on file  Occupational History  . Occupation: Customer service manager    Comment: Retired  Tobacco Use  . Smoking status: Passive Smoke Exposure - Never Smoker  . Smokeless tobacco: Never Used  Substance and Sexual Activity  . Alcohol use: No    Frequency: Never  . Drug use: Not on file  . Sexual activity: Not on file  Other Topics Concern  . Not on file  Social History Narrative   Has living will.   Son [name] is health care POA   Has DNR     Would not accept tube feeds     Constitutional: Denies fever, malaise, fatigue, headache or abrupt weight changes.  Skin: Pt reports corn of foot. Denies redness, rashes, or ulcercations.    No other specific complaints in a complete review of systems (except as listed in HPI above).     Objective:   Physical Exam   BP (!) 162/74   Pulse 70   Temp 98.1 F (36.7 C)   Resp (!) 24   Wt 142 lb (64.4 kg)   BMI 26.83 kg/m  Wt Readings from Last 3 Encounters:  02/21/18 142 lb (64.4 kg)  01/16/18 137 lb (62.1 kg)  11/20/17 135 lb (61.2 kg)    General: Appears her stated age, in NAD. Skin: 1 cm corn noted on plantar aspect of right foot.   BMET    Component Value Date/Time   NA 136 11/20/2017 1321   NA 138 07/12/2013 0344   K 4.2 11/20/2017 1321   K 4.1 07/12/2013 0344   CL 99 11/20/2017 1321   CL 105 07/12/2013 0344   CO2 30 11/20/2017 1321   CO2 28 07/12/2013 0344   GLUCOSE 85 11/20/2017 1321   GLUCOSE 113 (H) 07/12/2013 0344   BUN 16 11/20/2017 1321  BUN 16 07/12/2013 0344   CREATININE 0.86 11/20/2017 1321   CREATININE 0.82 07/12/2013 0344   CALCIUM 9.4 11/20/2017 1321   CALCIUM 9.2 07/12/2013 0344   GFRNONAA >60 10/15/2017 1148   GFRNONAA >60 07/12/2013 0344   GFRAA >60 10/15/2017 1148   GFRAA >60 07/12/2013 0344    Lipid Panel  No results found for: CHOL, TRIG, HDL, CHOLHDL, VLDL, LDLCALC  CBC    Component Value Date/Time   WBC 8.0 11/20/2017 1321   RBC 3.69 (L) 11/20/2017 1321   HGB 11.4 (L) 11/20/2017 1321   HGB 12.0 07/12/2013 0344   HCT 34.9 (L) 11/20/2017 1321   HCT 35.3 07/12/2013 0344   PLT 344.0 11/20/2017 1321   PLT 250 07/12/2013 0344   MCV 94.7 11/20/2017 1321   MCV 92 07/12/2013 0344   MCH 32.2 10/13/2017 0445   MCHC 32.6 11/20/2017 1321   RDW 13.1 11/20/2017 1321   RDW 12.7 07/12/2013 0344   LYMPHSABS 3.2 07/12/2013 0344   MONOABS 0.7 07/12/2013 0344   EOSABS 0.1 07/12/2013 0344   BASOSABS 0.1 07/12/2013 0344    Hgb  A1C No results found for: HGBA1C         Assessment & Plan:   Corn of Foot:  Corn filed down with use of dremmel tool by this provider Can use a pad to cushion to decrease pain with ambulation  Will reassess as needed Webb Silversmith, NP

## 2018-03-21 ENCOUNTER — Ambulatory Visit: Payer: Medicare Other | Admitting: Internal Medicine

## 2018-03-21 VITALS — BP 130/62 | HR 72 | Temp 98.1°F | Resp 18 | Wt 139.0 lb

## 2018-03-21 DIAGNOSIS — L84 Corns and callosities: Secondary | ICD-10-CM | POA: Diagnosis not present

## 2018-03-21 DIAGNOSIS — Z8673 Personal history of transient ischemic attack (TIA), and cerebral infarction without residual deficits: Secondary | ICD-10-CM | POA: Diagnosis not present

## 2018-03-21 DIAGNOSIS — F39 Unspecified mood [affective] disorder: Secondary | ICD-10-CM

## 2018-03-21 DIAGNOSIS — K219 Gastro-esophageal reflux disease without esophagitis: Secondary | ICD-10-CM | POA: Diagnosis not present

## 2018-04-05 ENCOUNTER — Encounter: Payer: Self-pay | Admitting: Internal Medicine

## 2018-04-05 NOTE — Assessment & Plan Note (Signed)
Residual expressive aphasia Continue Plavix unless deteriorates

## 2018-04-05 NOTE — Progress Notes (Addendum)
Subjective:    Patient ID: Debra Ho, female    DOB: Aug 03, 1915, 84 y.o.   MRN: [id number]  HPI  Asked to see in apt in ALF for routine visit.  Reviewed case with RN. RN reports painful callous to right 5th toe. No redness or drainage noted. Resident denies complaints. Independent with ADL's. Sleeping well with Remeron. Walks with walker No issues with bowel or bladder.  GERD: Controlled on Pantoprazole. She denies swallowing issues.   Hx of TIA: With some residual expressive aphasia. On Plavix.   Mood Disorder: Chronic but stable on Remeron.  Review of Systems      Past Medical History:  Diagnosis Date  . GERD without esophagitis   . Hyperlipemia   . Hypertension   . Hyponatremia 10/2017  . Mood disorder ([place])   . TIA (transient ischemic attack)     Current Outpatient Medications  Medication Sig Dispense Refill  . clopidogrel (PLAVIX) 75 MG tablet TAKE 1 TABLET BY MOUTH DAILY 90 tablet 3  . mirtazapine (REMERON) 15 MG tablet Take 0.5 tablets (7.5 mg total) by mouth at bedtime. 1 tablet 0  . pantoprazole (PROTONIX) 40 MG tablet Take 1 tablet (40 mg total) by mouth daily. 90 tablet 3  . Vitamin D, Ergocalciferol, (DRISDOL) 50000 units CAPS capsule Take 50,000 Units by mouth every 30 (thirty) days.     No current facility-administered medications for this visit.     Allergies  Allergen Reactions  . Penicillins   . Sulfa Antibiotics     History reviewed. No pertinent family history.  Social History   Socioeconomic History  . Marital status: Widowed    Spouse name: Not on file  . Number of children: 5  . Years of education: Not on file  . Highest education level: Not on file  Occupational History  . 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 Financial resource strain: Not on file  . Food insecurity:    Worry: Not on file    Inability: Not on file  . Transportation needs:    Medical: Not on file    Non-medical: Not on file  Tobacco  Use  . Smoking status: Passive Smoke Exposure - Never Smoker  . Smokeless tobacco: Never Used  Substance and Sexual Activity  . Alcohol use: No    Frequency: Never  . Drug use: Not on file  . Sexual activity: Not on file  Lifestyle  . Physical activity:    Days per week: Not on file    Minutes per session: Not on file  . Stress: Not on file  Relationships  . Social connections:    Talks on phone: Not on file    Gets together: Not on file    Attends religious service: Not on file    Active member of club or organization: Not on file    Attends meetings of clubs or organizations: Not on file    Relationship status: Not on file  . Intimate partner violence:    Fear of current or ex partner: Not on file    Emotionally abused: Not on file    Physically abused: Not on file    Forced sexual activity: Not on file  Other Topics Concern  . Not on file  Social History Narrative   Has living will.   Son [name] is health care POA   Has DNR   Would not accept tube feeds     Constitutional: Denies fever,  malaise, fatigue, headache or abrupt weight changes.  HEENT: Denies eye pain, eye redness, ear pain, ringing in the ears, wax buildup, runny nose, nasal congestion, bloody nose, or sore throat. Respiratory: Denies difficulty breathing, shortness of breath, cough or sputum production.   Cardiovascular: Denies chest pain, chest tightness, palpitations or swelling in the hands or feet.  Gastrointestinal: Denies abdominal pain, bloating, constipation, diarrhea or blood in the stool.  GU: Denies urgency, frequency, pain with urination, burning sensation, blood in urine, odor or discharge. Musculoskeletal: Denies decrease in range of motion, difficulty with gait, muscle pain or joint pain and swelling.  Skin: Denies redness, rashes, lesions or ulcercations.  Neurological: Pt reports trouble with memory.Denies dizziness, difficulty with speech or problems with balance and coordination.  Psych:  Denies anxiety, depression, SI/HI.  No other specific complaints in a complete review of systems (except as listed in HPI above).  Objective:   Physical Exam   BP 130/62   Pulse 72   Temp 98.1 F (36.7 C)   Resp 18   Wt 139 lb (63 kg)   BMI 26.26 kg/m  Wt Readings from Last 3 Encounters:  04/05/18 139 lb (63 kg)  02/21/18 142 lb (64.4 kg)  01/16/18 137 lb (62.1 kg)    General: Appears her stated age, in NAD. Skin: Small callous to right 5th toe. Too small to be debrided.  Cardiovascular: Normal rate and rhythm. S1,S2 noted.  No murmur, rubs or gallops noted. Trace BLE edema. No carotid bruits noted. Pulmonary/Chest: Normal effort and positive vesicular breath sounds. No respiratory distress. No wheezes, rales or ronchi noted.  Abdomen: Soft and nontender. Normal bowel sounds. No distention or masses noted.  Musculoskeletal: Gait slow and steady with use of rolling walker.  Neurological: Alert and oriented.  Psychiatric: Mood and affect normal. Behavior is normal. Judgment and thought content normal.    BMET    Component Value Date/Time   NA 136 11/20/2017 1321   NA 138 07/12/2013 0344   K 4.2 11/20/2017 1321   K 4.1 07/12/2013 0344   CL 99 11/20/2017 1321   CL 105 07/12/2013 0344   CO2 30 11/20/2017 1321   CO2 28 07/12/2013 0344   GLUCOSE 85 11/20/2017 1321   GLUCOSE 113 (H) 07/12/2013 0344   BUN 16 11/20/2017 1321   BUN 16 07/12/2013 0344   CREATININE 0.86 11/20/2017 1321   CREATININE 0.82 07/12/2013 0344   CALCIUM 9.4 11/20/2017 1321   CALCIUM 9.2 07/12/2013 0344   GFRNONAA >60 10/15/2017 1148   GFRNONAA >60 07/12/2013 0344   GFRAA >60 10/15/2017 1148   GFRAA >60 07/12/2013 0344    Lipid Panel  No results found for: CHOL, TRIG, HDL, CHOLHDL, VLDL, LDLCALC  CBC    Component Value Date/Time   WBC 8.0 11/20/2017 1321   RBC 3.69 (L) 11/20/2017 1321   HGB 11.4 (L) 11/20/2017 1321   HGB 12.0 07/12/2013 0344   HCT 34.9 (L) 11/20/2017 1321   HCT 35.3  07/12/2013 0344   PLT 344.0 11/20/2017 1321   PLT 250 07/12/2013 0344   MCV 94.7 11/20/2017 1321   MCV 92 07/12/2013 0344   MCH 32.2 10/13/2017 0445   MCHC 32.6 11/20/2017 1321   RDW 13.1 11/20/2017 1321   RDW 12.7 07/12/2013 0344   LYMPHSABS 3.2 07/12/2013 0344   MONOABS 0.7 07/12/2013 0344   EOSABS 0.1 07/12/2013 0344   BASOSABS 0.1 07/12/2013 0344    Hgb A1C No results found for: HGBA1C  Assessment & Plan:   Callous, Right 5th Toe:  Advised her to try Salicylic acid to affected area Try open toed TED's

## 2018-04-05 NOTE — Assessment & Plan Note (Signed)
Controlled with Pantoprazole Will monitor

## 2018-04-05 NOTE — Assessment & Plan Note (Signed)
Stable on Remeron Will monitor 

## 2018-04-05 NOTE — Patient Instructions (Signed)

## 2018-05-05 DIAGNOSIS — I10 Essential (primary) hypertension: Secondary | ICD-10-CM | POA: Diagnosis not present

## 2018-05-05 DIAGNOSIS — K219 Gastro-esophageal reflux disease without esophagitis: Secondary | ICD-10-CM | POA: Diagnosis not present

## 2018-05-05 DIAGNOSIS — G459 Transient cerebral ischemic attack, unspecified: Secondary | ICD-10-CM | POA: Diagnosis not present

## 2018-05-05 LAB — BASIC METABOLIC PANEL
BUN: 24 — AB (ref 4–21)
Creatinine: 0.8 (ref 0.5–1.1)

## 2018-05-05 LAB — CBC AND DIFFERENTIAL: Hemoglobin: 10.6 — AB (ref 12.0–16.0)

## 2018-05-07 ENCOUNTER — Other Ambulatory Visit: Payer: Self-pay | Admitting: Internal Medicine

## 2018-06-05 ENCOUNTER — Ambulatory Visit: Payer: Medicare Other | Admitting: Internal Medicine

## 2018-06-05 ENCOUNTER — Encounter: Payer: Self-pay | Admitting: Internal Medicine

## 2018-06-05 VITALS — BP 160/80 | HR 90 | Temp 97.8°F | Resp 20

## 2018-06-05 DIAGNOSIS — F39 Unspecified mood [affective] disorder: Secondary | ICD-10-CM

## 2018-06-05 DIAGNOSIS — Z8673 Personal history of transient ischemic attack (TIA), and cerebral infarction without residual deficits: Secondary | ICD-10-CM

## 2018-06-05 DIAGNOSIS — R011 Cardiac murmur, unspecified: Secondary | ICD-10-CM

## 2018-06-05 DIAGNOSIS — K219 Gastro-esophageal reflux disease without esophagitis: Secondary | ICD-10-CM

## 2018-06-05 DIAGNOSIS — I10 Essential (primary) hypertension: Secondary | ICD-10-CM

## 2018-06-05 NOTE — Assessment & Plan Note (Signed)
Mild and more satisfied here now Doing well with low dose mirtazapine May help appetite as well

## 2018-06-05 NOTE — Progress Notes (Signed)
Subjective:    Patient ID: Debra Ho, female    DOB: May 10, 1915, 84 y.o.   MRN: [id number]  HPI Visit in [place] room for follow up of chronic health conditions Reviewed status with facility RN  Generally doing okay Comfortable here in [place] Walks with rollator Aide every morning and evening to help with personal care Independent with bathroom----chronic urinary incontinence  No neurologic symptoms No weakness Does have rare left temple pain---usually brief Speech is normal  Sleeps well on the mirtazapine Mood is generally good  No heartburn on PPI No dysphagia Did have some problems with apple sauce in the past Tends to have dry mouth---drinks water  Current Outpatient Medications on File Prior to Visit  Medication Sig Dispense Refill  . clopidogrel (PLAVIX) 75 MG tablet TAKE 1 TABLET BY MOUTH DAILY 90 tablet 3  . docusate sodium (COLACE) 100 MG capsule Take 100 mg by mouth 2 (two) times daily.    . mirtazapine (REMERON) 15 MG tablet Take 0.5 tablets (7.5 mg total) by mouth at bedtime. 1 tablet 0  . pantoprazole (PROTONIX) 40 MG tablet TAKE 1 TABLET BY MOUTH DAILY 90 tablet 3  . Vitamin D, Ergocalciferol, (DRISDOL) 50000 units CAPS capsule Take 50,000 Units by mouth every 30 (thirty) days.     No current facility-administered medications on file prior to visit.     Allergies  Allergen Reactions  . Penicillins   . Sulfa Antibiotics     Past Medical History:  Diagnosis Date  . GERD without esophagitis   . Hyperlipemia   . Hypertension   . Hyponatremia 10/2017  . Mood disorder ([place])   . TIA (transient ischemic attack)     Past Surgical History:  Procedure Laterality Date  . ABDOMINAL HYSTERECTOMY  1950's    History reviewed. No pertinent family history.  Social History   Socioeconomic History  . Marital status: Widowed    Spouse name: Not on file  . Number of children: 5  . Years of education: Not on file  . Highest education level: Not on file    Occupational History  . 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 Financial resource strain: Not on file  . Food insecurity:    Worry: Not on file    Inability: Not on file  . Transportation needs:    Medical: Not on file    Non-medical: Not on file  Tobacco Use  . Smoking status: Passive Smoke Exposure - Never Smoker  . Smokeless tobacco: Never Used  Substance and Sexual Activity  . Alcohol use: No    Frequency: Never  . Drug use: Not on file  . Sexual activity: Not on file  Lifestyle  . Physical activity:    Days per week: Not on file    Minutes per session: Not on file  . Stress: Not on file  Relationships  . Social connections:    Talks on phone: Not on file    Gets together: Not on file    Attends religious service: Not on file    Active member of club or organization: Not on file    Attends meetings of clubs or organizations: Not on file    Relationship status: Not on file  . Intimate partner violence:    Fear of current or ex partner: Not on file    Emotionally abused: Not on file    Physically abused: Not on file    Forced sexual activity: Not on  file  Other Topics Concern  . Not on file  Social History Narrative   Has living will.   Son [name] is health care POA   Has DNR   Would not accept tube feeds   Review of Systems Bowels okay with stool softener Sleeps fine Appetite is good--food is okay Weight is  No sig arthritis pain     Objective:   Physical Exam  Constitutional: She appears well-developed. No distress.  Neck: No thyromegaly present.  Cardiovascular: Regular rhythm. Exam reveals no gallop.  Gr 3/6 systolic murmur Coarse like AS but loudest at apex  Respiratory: Effort normal and breath sounds normal. No respiratory distress. She has no wheezes. She has no rales.  GI: Soft. There is no tenderness.  Musculoskeletal: She exhibits no edema.  Lymphadenopathy:    She has no cervical adenopathy.  Psychiatric: She has a  normal mood and affect. Her behavior is normal.           Assessment & Plan:

## 2018-06-05 NOTE — Assessment & Plan Note (Signed)
BP Readings from Last 3 Encounters:  06/05/18 (!) 160/80  04/05/18 130/62  02/21/18 (!) 162/74   Very variable With her age, and valve issue---would be more concerned about orthostatic hypotension No Rx for now

## 2018-06-05 NOTE — Assessment & Plan Note (Signed)
Hard to tell Today it sounds more like AS---if so, it would be even more reason to be reluctant to use BP meds

## 2018-06-05 NOTE — Assessment & Plan Note (Signed)
Continues on plavix No new symptoms

## 2018-06-05 NOTE — Assessment & Plan Note (Signed)
Quiet on PPI 

## 2018-06-19 ENCOUNTER — Observation Stay
Admission: EM | Admit: 2018-06-19 | Discharge: 2018-06-20 | Disposition: A | Discharge status: Home / Self Care | Payer: Medicare Other | Attending: Internal Medicine | Admitting: Internal Medicine

## 2018-06-19 DIAGNOSIS — Z7902 Long term (current) use of antithrombotics/antiplatelets: Secondary | ICD-10-CM | POA: Diagnosis not present

## 2018-06-19 DIAGNOSIS — R4781 Slurred speech: Secondary | ICD-10-CM | POA: Diagnosis not present

## 2018-06-19 DIAGNOSIS — Z880 Allergy status to penicillin: Secondary | ICD-10-CM | POA: Insufficient documentation

## 2018-06-19 DIAGNOSIS — R29810 Facial weakness: Secondary | ICD-10-CM | POA: Diagnosis not present

## 2018-06-19 DIAGNOSIS — I700 Atherosclerosis of aorta: Secondary | ICD-10-CM | POA: Insufficient documentation

## 2018-06-19 DIAGNOSIS — K219 Gastro-esophageal reflux disease without esophagitis: Secondary | ICD-10-CM | POA: Diagnosis not present

## 2018-06-19 DIAGNOSIS — E785 Hyperlipidemia, unspecified: Secondary | ICD-10-CM | POA: Diagnosis not present

## 2018-06-19 DIAGNOSIS — I10 Essential (primary) hypertension: Secondary | ICD-10-CM | POA: Insufficient documentation

## 2018-06-19 DIAGNOSIS — Z882 Allergy status to sulfonamides status: Secondary | ICD-10-CM | POA: Diagnosis not present

## 2018-06-19 DIAGNOSIS — F39 Unspecified mood [affective] disorder: Secondary | ICD-10-CM | POA: Diagnosis not present

## 2018-06-19 DIAGNOSIS — R531 Weakness: Secondary | ICD-10-CM

## 2018-06-19 DIAGNOSIS — G459 Transient cerebral ischemic attack, unspecified: Principal | ICD-10-CM | POA: Insufficient documentation

## 2018-06-19 DIAGNOSIS — I6523 Occlusion and stenosis of bilateral carotid arteries: Secondary | ICD-10-CM | POA: Diagnosis not present

## 2018-06-19 DIAGNOSIS — S299XXA Unspecified injury of thorax, initial encounter: Secondary | ICD-10-CM | POA: Diagnosis not present

## 2018-06-19 DIAGNOSIS — Z79899 Other long term (current) drug therapy: Secondary | ICD-10-CM | POA: Diagnosis not present

## 2018-06-19 NOTE — ED Triage Notes (Signed)
Patient coming [facility] from [facility]. Patient hit her emergency button for assistance. Patient unresponsive at nurse and fire arrival. Patient awake at EMS arrival, however, had right-sided weakness, initially nonverbal transitioning into garbled speech, and right facial droop.   Patient no longer has weakness or garbled speech. Patient's droop has significantly improved per medic.

## 2018-06-19 NOTE — ED Provider Notes (Signed)
[facility] Emergency Department Provider Note  ____________________________________________   First MD Initiated Contact with Patient 06/19/18 2355     (approximate)  I have reviewed the triage vital signs and the nursing notes.   HISTORY  Chief Complaint Weakness  TIA  HPI Debra Ho is a 84 y.o. female [facility] was called because she was unresponsive when EMS got there she was responsive but had a dense right-sided flaccid paralysis and right-sided facial droop and could not talk as they were loading her up and bring return to the emergency room she regained her ability to speak normally right side is back to normal as possible there is a little bit of some facial droop left but she is speaking clearly and has no weakness or numbness any longer.  She has a history of TIAs in the past but last TIA was in 1999.   Past Medical History:  Diagnosis Date  . GERD without esophagitis   . Hyperlipemia   . Hypertension   . Hyponatremia 10/2017  . Mood disorder ([place])   . TIA (transient ischemic attack)     Patient Active Problem List   Diagnosis Date Noted  . Heart murmur 01/16/2018  . History of transient ischemic attack (TIA) 01/16/2018  . Essential hypertension, benign 11/20/2017  . Mood disorder ([place])   . GERD without esophagitis     Past Surgical History:  Procedure Laterality Date  . ABDOMINAL HYSTERECTOMY  1950's    Prior to Admission medications   Medication Sig Start Date End Date Taking? Authorizing Provider  clopidogrel (PLAVIX) 75 MG tablet TAKE 1 TABLET BY MOUTH DAILY 12/16/17  Yes Viviana Simpler I, MD  docusate sodium (COLACE) 100 MG capsule Take 100 mg by mouth 2 (two) times daily.   Yes [provider]  mirtazapine (REMERON) 15 MG tablet Take 0.5 tablets (7.5 mg total) by mouth at bedtime. 11/20/17  Yes Venia Carbon, MD  pantoprazole (PROTONIX) 40 MG tablet TAKE 1 TABLET BY MOUTH DAILY 05/07/18  Yes Venia Carbon, MD  Vitamin D, Ergocalciferol, (DRISDOL) 50000 units CAPS capsule Take 50,000 Units by mouth every 30 (thirty) days.   Yes [provider]    Allergies Penicillins and Sulfa antibiotics  No family history on file.  Social History Social History   Tobacco Use  . Smoking status: Passive Smoke Exposure - Never Smoker  . Smokeless tobacco: Never Used  Substance Use Topics  . Alcohol use: No    Frequency: Never  . Drug use: Not on file    Review of Systems  Constitutional: No fever/chills Eyes: No visual changes. ENT: No sore throat. Cardiovascular: Denies chest pain. Respiratory: Denies shortness of breath. Gastrointestinal: No abdominal pain.  No nausea, no vomiting.  No diarrhea.  No constipation. Genitourinary: Negative for dysuria. Musculoskeletal: Negative for back pain. Skin: Negative for rash. Neurological: Negative for headaches, focal weakness   ____________________________________________   PHYSICAL EXAM:  VITAL SIGNS: ED Triage Vitals  Enc Vitals Group     BP      Pulse      Resp      Temp      Temp src      SpO2      Weight      Height      Head Circumference      Peak Flow      Pain Score      Pain Loc      Pain  Edu?      Excl. in [place]?     Constitutional: Alert and oriented. Well appearing and in no acute distress. Eyes: Conjunctivae are normal. PERRL. EOMI. Head: Atraumatic. Nose: No congestion/rhinnorhea. Mouth/Throat: Mucous membranes are moist.  Oropharynx non-erythematous. Neck: No stridor.  Cardiovascular: Normal rate, regular rhythm. Grossly normal heart sounds with the exception of a slight systolic ejection murmur heard best at the left sternal border.  Good peripheral circulation. Respiratory: Normal respiratory effort.  No retractions. Lungs CTAB. Gastrointestinal: Soft and nontender. No distention. No abdominal bruits. No CVA tenderness. Musculoskeletal: No lower extremity tenderness nor edema.  No joint  effusions. Neurologic:  Normal speech and language. No gross focal neurologic deficits are appreciated.  Cranial nerves II through XII are intact patient denies any visual field cuts cerebellar finger-nose rapid alternating movements and hands are normal motor strength is 5/5 throughout sensation is intact per patient report Skin:  Skin is warm, dry and intact. No rash noted. Psychiatric: Mood and affect are normal. Speech and behavior are normal.  ____________________________________________   LABS (all labs ordered are listed, but only abnormal results are displayed)  Labs Reviewed  COMPREHENSIVE METABOLIC PANEL - Abnormal; Notable for the following components:      Result Value   Glucose, Bld 119 (*)    BUN 27 (*)    GFR calc non Af Amer 51 (*)    GFR calc Af Amer 59 (*)    All other components within normal limits  CBC WITH DIFFERENTIAL/PLATELET - Abnormal; Notable for the following components:   RBC 3.76 (*)    Hemoglobin 11.8 (*)    HCT 34.6 (*)    All other components within normal limits  URINALYSIS, COMPLETE (UACMP) WITH MICROSCOPIC   ____________________________________________  EKG  EKG read and interpreted by me shows normal sinus rhythm rate of 79 left axis poor R wave progression left anterior hemiblock computer is reading ST elevation inferior injury this is not present no change from 10/13/2017 ____________________________________________  RADIOLOGY  ED MD interpretation: Radiologist reports no active pulmonary disease.  I reviewed the film Official radiology report(s): CT of the head shows no acute disease Ct Head Wo Contrast  Result Date: 06/20/2018 CLINICAL DATA:  Right-sided weakness right facial droop EXAM: CT HEAD WITHOUT CONTRAST TECHNIQUE: Contiguous axial images were obtained from the base of the skull through the vertex without intravenous contrast. COMPARISON:  CT brain 10/13/2017 FINDINGS: Brain: No acute territorial infarction, hemorrhage or  intracranial mass. 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 Moderate atrophy. Moderate small vessel ischemic changes of the white matter. Stable ventricle size Vascular: No hyperdense vessels. Vertebral artery and carotid vascular calcification Skull: Normal. Negative for fracture or focal lesion. Sinuses/Orbits: No acute finding. Other: None IMPRESSION: 1. No CT evidence for acute intracranial abnormality. 2. Atrophy and small vessel ischemic changes of the white matter. 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   On: 06/20/2018 00:24   Dg Chest Portable 1 View  Result Date: 06/20/2018 CLINICAL DATA:  Patient was found unresponsive after hitting emergency call button. Right-sided weakness. Garbled speech and right facial droop. EXAM: PORTABLE CHEST 1 VIEW COMPARISON:  10/15/2017 FINDINGS: Cardiac enlargement. No vascular congestion. Interstitial fibrosis in the lungs similar to previous study. No airspace disease or consolidation. No blunting of costophrenic angles. No pneumothorax. Mediastinal contours appear intact. Calcification of the aorta. Degenerative changes in the spine and shoulders. IMPRESSION: Cardiac enlargement. Interstitial fibrosis in the lungs. No evidence of  active pulmonary disease. Aortic atherosclerosis. Electronically Signed   By: Lucienne Capers M.D.   On: 06/20/2018 00:09    ____________________________________________   PROCEDURES  Procedure(s) performed:   Procedures  Critical Care performed:   ____________________________________________   INITIAL IMPRESSION / ASSESSMENT AND PLAN / ED COURSE           ____________________________________________   FINAL CLINICAL IMPRESSION(S) / ED DIAGNOSES  Final diagnoses:  TIA (transient ischemic attack)     ED Discharge Orders    None       Note:  This document was prepared using Dragon voice recognition software and may include unintentional dictation errors.      Nena Polio, MD 06/20/18 0111

## 2018-06-20 ENCOUNTER — Emergency Department: Payer: Medicare Other

## 2018-06-20 ENCOUNTER — Other Ambulatory Visit: Payer: Self-pay

## 2018-06-20 ENCOUNTER — Observation Stay: Payer: Medicare Other

## 2018-06-20 ENCOUNTER — Observation Stay (HOSPITAL_BASED_OUTPATIENT_CLINIC_OR_DEPARTMENT_OTHER)
Admit: 2018-06-20 | Discharge: 2018-06-20 | Disposition: A | Discharge status: Home / Self Care | Payer: Medicare Other | Attending: Internal Medicine | Admitting: Internal Medicine

## 2018-06-20 DIAGNOSIS — E860 Dehydration: Secondary | ICD-10-CM | POA: Diagnosis not present

## 2018-06-20 DIAGNOSIS — R531 Weakness: Secondary | ICD-10-CM | POA: Diagnosis not present

## 2018-06-20 DIAGNOSIS — R29810 Facial weakness: Secondary | ICD-10-CM | POA: Diagnosis not present

## 2018-06-20 DIAGNOSIS — S299XXA Unspecified injury of thorax, initial encounter: Secondary | ICD-10-CM | POA: Diagnosis not present

## 2018-06-20 DIAGNOSIS — I340 Nonrheumatic mitral (valve) insufficiency: Secondary | ICD-10-CM | POA: Diagnosis not present

## 2018-06-20 DIAGNOSIS — G459 Transient cerebral ischemic attack, unspecified: Secondary | ICD-10-CM | POA: Diagnosis not present

## 2018-06-20 DIAGNOSIS — R4701 Aphasia: Secondary | ICD-10-CM | POA: Diagnosis not present

## 2018-06-20 DIAGNOSIS — I6523 Occlusion and stenosis of bilateral carotid arteries: Secondary | ICD-10-CM | POA: Diagnosis not present

## 2018-06-20 LAB — CBC WITH DIFFERENTIAL/PLATELET
BASOS ABS: 0.1 10*3/uL (ref 0–0.1)
Basophils Relative: 1 %
Eosinophils Absolute: 0.2 10*3/uL (ref 0–0.7)
Eosinophils Relative: 3 %
HEMATOCRIT: 34.6 % — AB (ref 35.0–47.0)
HEMOGLOBIN: 11.8 g/dL — AB (ref 12.0–16.0)
LYMPHS PCT: 38 %
Lymphs Abs: 2.6 10*3/uL (ref 1.0–3.6)
MCH: 31.4 pg (ref 26.0–34.0)
MCHC: 34.2 g/dL (ref 32.0–36.0)
MCV: 91.9 fL (ref 80.0–100.0)
MONOS PCT: 12 %
Monocytes Absolute: 0.8 10*3/uL (ref 0.2–0.9)
NEUTROS ABS: 3.2 10*3/uL (ref 1.4–6.5)
NEUTROS PCT: 46 %
Platelets: 250 10*3/uL (ref 150–440)
RBC: 3.76 MIL/uL — ABNORMAL LOW (ref 3.80–5.20)
RDW: 13.4 % (ref 11.5–14.5)
WBC: 6.9 10*3/uL (ref 3.6–11.0)

## 2018-06-20 LAB — URINALYSIS, COMPLETE (UACMP) WITH MICROSCOPIC
BACTERIA UA: NONE SEEN
BILIRUBIN URINE: NEGATIVE
Glucose, UA: NEGATIVE mg/dL
Hgb urine dipstick: NEGATIVE
KETONES UR: NEGATIVE mg/dL
NITRITE: NEGATIVE
PROTEIN: NEGATIVE mg/dL
Specific Gravity, Urine: 1.004 — ABNORMAL LOW (ref 1.005–1.030)
pH: 7 (ref 5.0–8.0)

## 2018-06-20 LAB — COMPREHENSIVE METABOLIC PANEL
ALBUMIN: 3.8 g/dL (ref 3.5–5.0)
ALK PHOS: 52 U/L (ref 38–126)
ALT: 10 U/L (ref 0–44)
ANION GAP: 6 (ref 5–15)
AST: 16 U/L (ref 15–41)
BUN: 27 mg/dL — ABNORMAL HIGH (ref 8–23)
CO2: 28 mmol/L (ref 22–32)
Calcium: 9.1 mg/dL (ref 8.9–10.3)
Chloride: 105 mmol/L (ref 98–111)
Creatinine, Ser: 0.89 mg/dL (ref 0.44–1.00)
GFR calc non Af Amer: 51 mL/min — ABNORMAL LOW (ref >60)
GFR, EST AFRICAN AMERICAN: 59 mL/min — AB (ref >60)
GLUCOSE: 119 mg/dL — AB (ref 70–99)
POTASSIUM: 4.1 mmol/L (ref 3.5–5.1)
SODIUM: 139 mmol/L (ref 135–145)
Total Bilirubin: 0.6 mg/dL (ref 0.3–1.2)
Total Protein: 7.4 g/dL (ref 6.5–8.1)

## 2018-06-20 LAB — MRSA PCR SCREENING: MRSA BY PCR: NEGATIVE

## 2018-06-20 LAB — ECHOCARDIOGRAM COMPLETE
HEIGHTINCHES: 61 in
Weight: 2226 oz

## 2018-06-20 MED ORDER — MIRTAZAPINE 15 MG PO TABS: 7.5000 mg | ORAL_TABLET | Freq: Every day | ORAL | Status: DC

## 2018-06-20 MED ORDER — HYDRALAZINE HCL 25 MG PO TABS: 25.0000 mg | ORAL_TABLET | Freq: Three times a day (TID) | ORAL | 0 refills | Status: DC

## 2018-06-20 MED ORDER — LACTATED RINGERS IV SOLN
INTRAVENOUS | Status: AC
  Administered 2018-06-20: 03:00:00 via INTRAVENOUS

## 2018-06-20 MED ORDER — ACETAMINOPHEN 160 MG/5ML PO SOLN
650.0000 mg | ORAL | Status: DC | PRN
  Filled 2018-06-20: qty 20.3

## 2018-06-20 MED ORDER — ACETAMINOPHEN 650 MG RE SUPP: 650.0000 mg | RECTAL | Status: DC | PRN

## 2018-06-20 MED ORDER — BISACODYL 5 MG PO TBEC: 5.0000 mg | DELAYED_RELEASE_TABLET | Freq: Every day | ORAL | Status: DC | PRN

## 2018-06-20 MED ORDER — STROKE: EARLY STAGES OF RECOVERY BOOK
Freq: Once | Status: AC
  Administered 2018-06-20: 04:00:00

## 2018-06-20 MED ORDER — HYDRALAZINE HCL 50 MG PO TABS
25.0000 mg | ORAL_TABLET | Freq: Once | ORAL | Status: AC
Start: 2018-06-20 — End: 2018-06-20
  Administered 2018-06-20: 16:00:00 25 mg via ORAL
  Filled 2018-06-20: qty 1

## 2018-06-20 MED ORDER — ENOXAPARIN SODIUM 30 MG/0.3ML ~~LOC~~ SOLN: 30.0000 mg | SUBCUTANEOUS | Status: DC

## 2018-06-20 MED ORDER — CLOPIDOGREL BISULFATE 75 MG PO TABS
75.0000 mg | ORAL_TABLET | Freq: Every day | ORAL | Status: DC
  Administered 2018-06-20: 75 mg via ORAL
  Filled 2018-06-20: qty 1

## 2018-06-20 MED ORDER — ONDANSETRON HCL 4 MG/2ML IJ SOLN: 4.0000 mg | Freq: Four times a day (QID) | INTRAMUSCULAR | Status: DC | PRN

## 2018-06-20 MED ORDER — ACETAMINOPHEN 325 MG PO TABS: 650.0000 mg | ORAL_TABLET | ORAL | Status: DC | PRN

## 2018-06-20 MED ORDER — SENNOSIDES-DOCUSATE SODIUM 8.6-50 MG PO TABS: 1.0000 | ORAL_TABLET | Freq: Every evening | ORAL | Status: DC | PRN

## 2018-06-20 MED ORDER — PANTOPRAZOLE SODIUM 40 MG PO TBEC
40.0000 mg | DELAYED_RELEASE_TABLET | Freq: Every day | ORAL | Status: DC
  Administered 2018-06-20: 09:00:00 40 mg via ORAL
  Filled 2018-06-20: qty 1

## 2018-06-20 MED ORDER — ASPIRIN 81 MG PO CHEW
324.0000 mg | CHEWABLE_TABLET | Freq: Once | ORAL | Status: AC
  Administered 2018-06-20: 324 mg via ORAL
  Filled 2018-06-20: qty 4

## 2018-06-20 NOTE — ED Notes (Signed)
Patient intermittently stuttering. This is baseline for patient.

## 2018-06-20 NOTE — H&P (Signed)
[facility] [place] NAME: Debra Ho    MR#:  [id number]  DATE OF BIRTH:  01-Aug-1915  DATE OF ADMISSION:  06/19/2018  PRIMARY CARE PHYSICIAN: Venia Carbon, MD   REQUESTING/REFERRING PHYSICIAN: Nena Polio, MD  CHIEF COMPLAINT:   Chief Complaint  Patient presents with  . Weakness    HISTORY OF PRESENT ILLNESS:  Debra Ho  is a 84 y.o. female with a known history of HTN, TIA (1999, on Plavix), GERD, p/w 1d Hx transient R-sided weakness + expressive aphasia. Pt denies LOC. Pt lives at [facility] assisted living. She is independent and functional. She states that she sat down in a chair on the evening of 07/04, but was not positioned properly, and was sliding out of the chair. She was unable to resposition herself because she felt too weak. She pushed her pendant button. [facility] [employer] arrived. She attempted to tell them what was happening, but was "unable to get the words out." She also reportedly had some facial droop. EMS was called. At the time of my assessment, she states she is still having mild difficulty speaking, but I am able to understand her without difficulty. She stutters intermittently, and has baseline hearing loss (for which she uses hearing aids, of which she only has her left side device in at present), but she does not exhibit any other focal neurological deficits on examination. She is mildly dehydrated. She denies any other symptoms. She is AAOx3, comfortable, well-appearing, non-septic/toxic appearing, and in no acute distress.  PAST MEDICAL HISTORY:   Past Medical History:  Diagnosis Date  . GERD without esophagitis   . Hyperlipemia   . Hypertension   . Hyponatremia 10/2017  . Mood disorder ([place])   . TIA (transient ischemic attack)     PAST SURGICAL HISTORY:   Past Surgical History:  Procedure Laterality Date  . ABDOMINAL HYSTERECTOMY  1950's    SOCIAL HISTORY:   Social History    Tobacco Use  . Smoking status: Passive Smoke Exposure - Never Smoker  . Smokeless tobacco: Never Used  Substance Use Topics  . Alcohol use: No    Frequency: Never    FAMILY HISTORY:  History reviewed. No pertinent family history.  DRUG ALLERGIES:   Allergies  Allergen Reactions  . Penicillins   . Sulfa Antibiotics     REVIEW OF SYSTEMS:   Review of Systems  Constitutional: Negative for chills, diaphoresis, fever, malaise/fatigue and weight loss.  HENT: Positive for hearing loss (baseline hearing loss). Negative for congestion, ear pain, nosebleeds, sinus pain, sore throat and tinnitus.   Eyes: Negative for blurred vision, double vision and photophobia.  Respiratory: Negative for cough, hemoptysis, sputum production and shortness of breath.   Cardiovascular: Negative for chest pain, palpitations, orthopnea, claudication, leg swelling and PND.  Gastrointestinal: Negative for abdominal pain, blood in stool, constipation, diarrhea, heartburn, melena, nausea and vomiting.  Genitourinary: Negative for dysuria, frequency, hematuria and urgency.  Musculoskeletal: Negative for back pain, joint pain, myalgias and neck pain.  Skin: Negative for itching and rash.  Neurological: Positive for speech change, focal weakness and weakness. Negative for dizziness, tingling, tremors, sensory change, seizures, loss of consciousness and headaches.  Psychiatric/Behavioral: Negative for memory loss. The patient does not have insomnia.    MEDICATIONS AT HOME:   Prior to Admission medications   Medication Sig Start Date End Date Taking? Authorizing Provider  clopidogrel (PLAVIX) 75 MG tablet TAKE 1 TABLET BY MOUTH  DAILY 12/16/17  Yes Venia Carbon, MD  docusate sodium (COLACE) 100 MG capsule Take 100 mg by mouth 2 (two) times daily.   Yes [provider]  mirtazapine (REMERON) 15 MG tablet Take 0.5 tablets (7.5 mg total) by mouth at bedtime. 11/20/17  Yes Venia Carbon, MD   pantoprazole (PROTONIX) 40 MG tablet TAKE 1 TABLET BY MOUTH DAILY 05/07/18  Yes Venia Carbon, MD  Vitamin D, Ergocalciferol, (DRISDOL) 50000 units CAPS capsule Take 50,000 Units by mouth every 30 (thirty) days.   Yes [provider]      VITAL SIGNS:  Blood pressure (!) 168/58, pulse 78, temperature 98.2 F (36.8 C), resp. rate (!) 22, weight 63.5 kg (140 lb), SpO2 96 %.  PHYSICAL EXAMINATION:  Physical Exam  Constitutional: She is oriented to person, place, and time. She appears well-developed and well-nourished. She is active and cooperative.  Non-toxic appearance. She does not have a sickly appearance. She does not appear ill. No distress. She is not intubated.  HENT:  Head: Normocephalic and atraumatic.  Mouth/Throat: Oropharynx is clear and moist. No oropharyngeal exudate.  Eyes: Conjunctivae, EOM and lids are normal. No scleral icterus.  Neck: Neck supple. No JVD present. No thyromegaly present.  Cardiovascular: Normal rate, regular rhythm, S1 normal and S2 normal.  No extrasystoles are present. Exam reveals no gallop, no S3, no S4, no distant heart sounds and no friction rub.  Murmur heard.  Systolic murmur is present with a grade of 2/6. Pulmonary/Chest: Effort normal and breath sounds normal. No accessory muscle usage or stridor. No apnea, no tachypnea and no bradypnea. She is not intubated. No respiratory distress. She has no decreased breath sounds. She has no wheezes. She has no rhonchi. She has no rales.  Abdominal: Soft. Bowel sounds are normal. She exhibits no distension. There is no tenderness. There is no rebound and no guarding.  Musculoskeletal: Normal range of motion. She exhibits no edema or tenderness.  Lymphadenopathy:    She has no cervical adenopathy.  Neurological: She is alert and oriented to person, place, and time. She has normal strength and normal reflexes. She is not disoriented. No cranial nerve deficit or sensory deficit.  Skin: Skin is warm,  dry and intact. No rash noted. She is not diaphoretic. No erythema.  Psychiatric: She has a normal mood and affect. Her speech is normal and behavior is normal. Judgment and thought content normal. Cognition and memory are normal.   LABORATORY PANEL:   CBC Recent Labs  Lab 06/20/18 0009  WBC 6.9  HGB 11.8*  HCT 34.6*  PLT 250   ------------------------------------------------------------------------------------------------------------------  Chemistries  Recent Labs  Lab 06/20/18 0009  NA 139  K 4.1  CL 105  CO2 28  GLUCOSE 119*  BUN 27*  CREATININE 0.89  CALCIUM 9.1  AST 16  ALT 10  ALKPHOS 52  BILITOT 0.6   ------------------------------------------------------------------------------------------------------------------  Cardiac Enzymes No results for input(s): TROPONINI in the last 168 hours. ------------------------------------------------------------------------------------------------------------------  RADIOLOGY:  Ct Head Wo Contrast  Result Date: 06/20/2018 CLINICAL DATA:  Right-sided weakness right facial droop EXAM: CT HEAD WITHOUT CONTRAST TECHNIQUE: Contiguous axial images were obtained from the base of the skull through the vertex without intravenous contrast. COMPARISON:  CT brain 10/13/2017 FINDINGS: Brain: No acute territorial infarction, hemorrhage or intracranial mass. 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 Moderate atrophy. Moderate small vessel ischemic changes of the white matter. Stable ventricle size Vascular: No hyperdense vessels. Vertebral artery and carotid vascular calcification Skull: Normal.  Negative for fracture or focal lesion. Sinuses/Orbits: No acute finding. Other: None IMPRESSION: 1. No CT evidence for acute intracranial abnormality. 2. Atrophy and small vessel ischemic changes of the white matter. 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   On: 06/20/2018 00:24   Dg Chest Portable 1  View  Result Date: 06/20/2018 CLINICAL DATA:  Patient was found unresponsive after hitting emergency call button. Right-sided weakness. Garbled speech and right facial droop. EXAM: PORTABLE CHEST 1 VIEW COMPARISON:  10/15/2017 FINDINGS: Cardiac enlargement. No vascular congestion. Interstitial fibrosis in the lungs similar to previous study. No airspace disease or consolidation. No blunting of costophrenic angles. No pneumothorax. Mediastinal contours appear intact. Calcification of the aorta. Degenerative changes in the spine and shoulders. IMPRESSION: Cardiac enlargement. Interstitial fibrosis in the lungs. No evidence of active pulmonary disease. Aortic atherosclerosis. Electronically Signed   By: Lucienne Capers M.D.   On: 06/20/2018 00:09   IMPRESSION AND PLAN:   A/P: 89F p/w transient R-sided weakness + expressive aphasia, neurological symptoms largely resolved. CT head (-) acute intracranial abnl. Also w/ dehydration, elevated BP/HTN, hyperglycemia, normocytic anemia. -TIA/CVA pathway -Tele, cardiac monitoring -NIHSS, Neuro checks -Fall precautions -Received ASA in ED, c/w Plavix 75 (home med) -MRI brain, Echo, carotid U/S pending -NPO, swallow eval, cardiac diet if passed -Pt endorses abnl speech, SLP eval pending -No focal strength deficits, PT/OT not consulted -BP 180s, permissive HTN -Per family, PCP stopped antihypertensives. May need to restart on low dose -Lipids, HbA1c pending -Gentle IVF -Normocytic anemia, likely 2/2 anemia of chronic disease, no evidence of acute blood loss -c/w home meds -NPO for now -Lovenox -Full code -Observation, < 2 midnights   All the records are reviewed and case discussed with ED provider. Management plans discussed with the patient, family and they are in agreement.  CODE STATUS: Full code  TOTAL TIME TAKING CARE OF THIS PATIENT: 75 minutes.    Arta Silence M.D on 06/20/2018 at 2:23 AM  Between 7am to 6pm - Pager -  [phone]  After 6pm go to [email or web address] - Proofreader  [facility]  Office  [phone]  CC: Primary care physician; Venia Carbon, MD   Note: This dictation was prepared with Dragon dictation along with smaller phrase technology. Any transcriptional errors that result from this process are unintentional.

## 2018-06-20 NOTE — Discharge Summary (Signed)
Debra Ho, is a 84 y.o. female  DOB 1915/12/01  MRN [id number].  Admission date:  06/19/2018  Admitting Physician  Arta Silence, MD  Discharge Date:  06/20/2018   Primary MD  Venia Carbon, MD  Recommendations for primary care physician for things to follow:   follow-up with PCP in 1 week   Admission Diagnosis  TIA (transient ischemic attack) [G45.9]   Discharge Diagnosis  TIA (transient ischemic attack) [G45.9]    Active Problems:   Acute right-sided weakness      Past Medical History:  Diagnosis Date  . GERD without esophagitis   . Hyperlipemia   . Hypertension   . Hyponatremia 10/2017  . Mood disorder ([place])   . TIA (transient ischemic attack)     Past Surgical History:  Procedure Laterality Date  . ABDOMINAL HYSTERECTOMY  1950's       History of present illness and  Hospital Course:     Kindly see H&P for history of present illness and admission details, please review complete Labs, Consult reports and Test reports for all details in brief  HPI  from the history and physical done on the day of admission 84 year old female patient with history of hypertension, TIA, GERD had an episode of transient right-sided weakness, expressive aphasia at [facility] and she is admitted to stroke unit for possible TIA.  Patient reportedly had some facial droop because of concern EMS was called.   Hospital Course  Transient right-sided weakness, expressive aphasia that happened at [facility]' patient is completely alert, awake, neurologically intact on my exam.  CT head unremarkable, patient could not lie down flat for MRI of the brain.  Carotid ultrasound did not show any hemodynamically significant stenosis.  Echocardiogram results are pending.  Patient already is taking Plavix at [facility].  Likely  discharge later today after physical therapy evaluation, echcardiogram results back to [facility].  2 hyperlipidemia: Patient is not on statins. 3.  Hypertension: Patient not on any BP medicines at home. 4.  Mood disorder: Patient is on Remeron at [facility].   Discharge Condition: Able   Follow UP  Follow-up Information    Venia Carbon, MD. Schedule an appointment as soon as possible for a visit in 1 week(s).   Specialties:  Internal Medicine, Pediatrics Contact information: [place] [address] [phone]             Discharge Instructions  and  Discharge Medications      Allergies as of 06/20/2018      Reactions   Penicillins    Sulfa Antibiotics       Medication List    TAKE these medications   clopidogrel 75 MG tablet Commonly known as:  PLAVIX TAKE 1 TABLET BY MOUTH DAILY   docusate sodium 100 MG capsule Commonly known as:  COLACE Take 100 mg by mouth 2 (two) times daily.   mirtazapine 15 MG tablet Commonly known as:  REMERON Take 0.5 tablets (7.5 mg total) by mouth at bedtime.   pantoprazole 40 MG tablet Commonly known as:  PROTONIX TAKE 1 TABLET BY MOUTH DAILY   Vitamin D (Ergocalciferol) 50000 units Caps capsule Commonly known as:  DRISDOL Take 50,000 Units by mouth every 30 (thirty) days.         Diet and Activity recommendation: See Discharge Instructions above   Consults obtained; physical therapy   Major procedures and Radiology Reports - PLEASE review detailed and final reports  for all details, in brief -     Ct Head Wo Contrast  Result Date: 06/20/2018 CLINICAL DATA:  Right-sided weakness right facial droop EXAM: CT HEAD WITHOUT CONTRAST TECHNIQUE: Contiguous axial images were obtained from the base of the skull through the vertex without intravenous contrast. COMPARISON:  CT brain 10/13/2017 FINDINGS: Brain: No acute territorial infarction, hemorrhage or intracranial mass. Old right basal ganglial  infarcts. Moderate atrophy. Moderate small vessel ischemic changes of the white matter. Stable ventricle size Vascular: No hyperdense vessels. Vertebral artery and carotid vascular calcification Skull: Normal. Negative for fracture or focal lesion. Sinuses/Orbits: No acute finding. Other: None IMPRESSION: 1. No CT evidence for acute intracranial abnormality. 2. Atrophy and small vessel ischemic changes of the white matter. 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   On: 06/20/2018 00:24   US Carotid Bilateral (at [facility])  Result Date: 06/20/2018 CLINICAL DATA:  Right-sided weakness, TIA EXAM: BILATERAL CAROTID DUPLEX ULTRASOUND TECHNIQUE: Pearline Cables scale imaging, color Doppler and duplex ultrasound was performed of bilateral carotid and vertebral arteries in the neck. COMPARISON:  None available TECHNIQUE: Quantification of carotid stenosis is based on velocity parameters that correlate the residual internal carotid diameter with NASCET-based stenosis levels, using the diameter of the distal internal carotid lumen as the denominator for stenosis measurement. The following velocity measurements were obtained: PEAK SYSTOLIC/END DIASTOLIC RIGHT ICA:                     99/14cm/sec CCA:                     381/0FB/PZW SYSTOLIC ICA/CCA RATIO:  2.58 ECA:                     181cm/sec LEFT ICA:                     106/13cm/sec CCA:                     527/78EU/MPN SYSTOLIC ICA/CCA RATIO:  3.61 ECA:                     168cm/sec FINDINGS: RIGHT CAROTID ARTERY: Calcified plaque at the carotid bifurcation extending into proximal internal and external carotid arteries resulting in mild stenosis. Normal waveforms and color Doppler signal. RIGHT VERTEBRAL ARTERY:  Normal flow direction and waveform. LEFT CAROTID ARTERY: Partially calcified carotid bifurcation plaque extending into proximal ICA. No high-grade stenosis. Normal waveforms and color Doppler signal. LEFT VERTEBRAL  ARTERY: Normal flow direction and waveform. IMPRESSION: 1. Mild bilateral carotid bifurcation and proximal ICA plaque resulting in less than 50% diameter stenosis. 2. Antegrade bilateral vertebral arterial flow. Electronically Signed   By: Lucrezia Europe M.D.   On: 06/20/2018 11:23   Dg Chest Portable 1 View  Result Date: 06/20/2018 CLINICAL DATA:  Patient was found unresponsive after hitting emergency call button. Right-sided weakness. Garbled speech and right facial droop. EXAM: PORTABLE CHEST 1 VIEW COMPARISON:  10/15/2017 FINDINGS: Cardiac enlargement. No vascular congestion. Interstitial fibrosis in the lungs similar to previous study. No airspace disease or consolidation. No blunting of costophrenic angles. No pneumothorax. Mediastinal contours appear intact. Calcification of the aorta. Degenerative changes in the spine and shoulders. IMPRESSION: Cardiac enlargement. Interstitial fibrosis in the lungs. No evidence of active pulmonary disease. Aortic atherosclerosis. Electronically Signed   By: Lucienne Capers M.D.   On: 06/20/2018  00:09    Micro Results     Recent Results (from the past 240 hour(s))  MRSA PCR Screening     Status: None   Collection Time: 06/20/18  5:19 AM  Result Value Ref Range Status   MRSA by PCR NEGATIVE NEGATIVE Final    Comment:        The GeneXpert MRSA Assay (FDA approved for NASAL specimens only), is one component of a comprehensive MRSA colonization surveillance program. It is not intended to diagnose MRSA infection nor to guide or monitor treatment for MRSA infections. Performed at [facility], [address] [place], [place] [address]        Today   Subjective:   Deloma Spindle to feels better, no weakness in hands or legs, speech is clear, wants to go back to [facility] if the imaging is within normal range  Objective:   Blood pressure (!) 174/63, pulse 74, temperature 98.1 F (36.7 C), temperature source Oral, resp. rate 16,  height 5\' 1"  (1.549 m), weight 63.1 kg (139 lb 2 oz), SpO2 94 %.   Intake/Output Summary (Last 24 hours) at 06/20/2018 1324 Last data filed at 06/20/2018 1021 Gross per 24 hour  Intake 605 ml  Output -  Net 605 ml    Exam Awake Alert, Oriented x 3, No new F.N deficits, Normal affect [place].AT,PERRAL Supple Neck,No JVD, No cervical lymphadenopathy appriciated.  Symmetrical Chest wall movement, Good air movement bilaterally, CTAB RRR,No Gallops,Rubs or new Murmurs, No Parasternal Heave +ve B.Sounds, Abd Soft, Non tender, No organomegaly appriciated, No rebound -guarding or rigidity. No Cyanosis, Clubbing or edema, No new Rash or bruise  Data Review   CBC w Diff:  Lab Results  Component Value Date   WBC 6.9 06/20/2018   HGB 11.8 (L) 06/20/2018   HGB 12.0 07/12/2013   HCT 34.6 (L) 06/20/2018   HCT 35.3 07/12/2013   PLT 250 06/20/2018   PLT 250 07/12/2013   LYMPHOPCT 38 06/20/2018   LYMPHOPCT 39.9 07/12/2013   MONOPCT 12 06/20/2018   MONOPCT 9.0 07/12/2013   EOSPCT 3 06/20/2018   EOSPCT 1.8 07/12/2013   BASOPCT 1 06/20/2018   BASOPCT 1.0 07/12/2013    CMP:  Lab Results  Component Value Date   NA 139 06/20/2018   NA 138 07/12/2013   K 4.1 06/20/2018   K 4.1 07/12/2013   CL 105 06/20/2018   CL 105 07/12/2013   CO2 28 06/20/2018   CO2 28 07/12/2013   BUN 27 (H) 06/20/2018   BUN 24 (A) 05/05/2018   BUN 16 07/12/2013   CREATININE 0.89 06/20/2018   CREATININE 0.82 07/12/2013   PROT 7.4 06/20/2018   PROT 7.3 07/12/2013   ALBUMIN 3.8 06/20/2018   ALBUMIN 3.5 07/12/2013   BILITOT 0.6 06/20/2018   BILITOT 0.2 07/12/2013   ALKPHOS 52 06/20/2018   ALKPHOS 62 07/12/2013   AST 16 06/20/2018   AST 20 07/12/2013   ALT 10 06/20/2018   ALT 16 07/12/2013  .   Total Time in preparing paper work, data evaluation and todays exam - 35 minutes  Epifanio Lesches M.D on 06/20/2018 at 1:24 PM    Note: This dictation was prepared with Dragon dictation along with smaller phrase  technology. Any transcriptional errors that result from this process are unintentional.

## 2018-06-20 NOTE — ED Notes (Signed)
Patient reminded urine sample is needed.  

## 2018-06-20 NOTE — Discharge Summary (Signed)
Debra Ho, is a 84 y.o. female  DOB [birth year]  MRN [id number].  Admission date:  06/19/2018  Admitting Physician  Arta Silence, MD  Discharge Date:  06/20/2018   Primary MD  Venia Carbon, MD  Recommendations for primary care physician for things to follow:   follow-up with PCP in 1 week   Admission Diagnosis  TIA (transient ischemic attack) [G45.9]   Discharge Diagnosis  TIA (transient ischemic attack) [G45.9]    Active Problems:   Acute right-sided weakness      Past Medical History:  Diagnosis Date  . GERD without esophagitis   . Hyperlipemia   . Hypertension   . Hyponatremia 10/2017  . Mood disorder ([place])   . TIA (transient ischemic attack)     Past Surgical History:  Procedure Laterality Date  . ABDOMINAL HYSTERECTOMY  1950's       History of present illness and  Hospital Course:     Kindly see H&P for history of present illness and admission details, please review complete Labs, Consult reports and Test reports for all details in brief  HPI  from the history and physical done on the day of admission 84 year old female patient with history of hypertension, TIA, GERD had an episode of transient right-sided weakness, expressive aphasia at [facility] and she is admitted to stroke unit for possible TIA.  Patient reportedly had some facial droop because of concern EMS was called.   Hospital Course  Transient right-sided weakness, expressive aphasia that happened at [facility]' patient is completely alert, awake, neurologically intact on my exam.  CT head unremarkable, patient could not lie down flat for MRI of the brain.  Carotid ultrasound did not show any hemodynamically significant stenosis.  Echocardiogram results are pending.  Patient already is taking Plavix at [facility].  Likely  discharge later today after physical therapy evaluation, echcardiogram results back to [facility]. Patient cannot have MRI of the brain, discussed with Dr. Irish Elders from neurology, recommended no further work-up because of his advanced age,.  2. hyperlipidemia: Patient is not on statins.   3.  Hypertension: Patient not on any BP medicines at home.  Blood pressure is elevated to 193/88: Add hydralazine 25 mg p.o. 3 times daily for discharge medication.  4.  Mood disorder: Patient is on Remeron   at [facility].   Discharge Condition: Able   Follow UP  Follow-up Information    Venia Carbon, MD. Schedule an appointment as soon as possible for a visit in 1 week(s).   Specialties:  Internal Medicine, Pediatrics Contact information: [place] [address] [phone]             Discharge Instructions  and  Discharge Medications      Allergies as of 06/20/2018      Reactions   Penicillins    Sulfa Antibiotics       Medication List    TAKE these medications   clopidogrel 75 MG tablet Commonly known as:  PLAVIX TAKE 1 TABLET BY MOUTH DAILY   docusate sodium 100 MG capsule Commonly known as:  COLACE Take 100 mg by mouth 2 (two) times daily.   hydrALAZINE 25 MG tablet Commonly known as:  APRESOLINE Take 1 tablet (25 mg total) by mouth 3 (three) times daily.   mirtazapine 15 MG tablet Commonly known as:  REMERON Take 0.5 tablets (7.5 mg total) by mouth at bedtime.   pantoprazole 40 MG tablet Commonly known  as:  PROTONIX TAKE 1 TABLET BY MOUTH DAILY   Vitamin D (Ergocalciferol) 50000 units Caps capsule Commonly known as:  DRISDOL Take 50,000 Units by mouth every 30 (thirty) days.         Diet and Activity recommendation: See Discharge Instructions above   Consults obtained; physical therapy   Major procedures and Radiology Reports - PLEASE review detailed and final reports for all details, in brief -     Ct Head Wo  Contrast  Result Date: 06/20/2018 CLINICAL DATA:  Right-sided weakness right facial droop EXAM: CT HEAD WITHOUT CONTRAST TECHNIQUE: Contiguous axial images were obtained from the base of the skull through the vertex without intravenous contrast. COMPARISON:  CT brain 10/13/2017 FINDINGS: Brain: No acute territorial infarction, hemorrhage or intracranial mass. 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 Moderate atrophy. Moderate small vessel ischemic changes of the white matter. Stable ventricle size Vascular: No hyperdense vessels. Vertebral artery and carotid vascular calcification Skull: Normal. Negative for fracture or focal lesion. Sinuses/Orbits: No acute finding. Other: None IMPRESSION: 1. No CT evidence for acute intracranial abnormality. 2. Atrophy and small vessel ischemic changes of the white matter. 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   On: 06/20/2018 00:24   US Carotid Bilateral (at [facility] Only)  Result Date: 06/20/2018 CLINICAL DATA:  Right-sided weakness, TIA EXAM: BILATERAL CAROTID DUPLEX ULTRASOUND TECHNIQUE: Pearline Cables scale imaging, color Doppler and duplex ultrasound was performed of bilateral carotid and vertebral arteries in the neck. COMPARISON:  None available TECHNIQUE: Quantification of carotid stenosis is based on velocity parameters that correlate the residual internal carotid diameter with NASCET-based stenosis levels, using the diameter of the distal internal carotid lumen as the denominator for stenosis measurement. The following velocity measurements were obtained: PEAK SYSTOLIC/END DIASTOLIC RIGHT ICA:                     99/14cm/sec CCA:                     578/4ON/GEX SYSTOLIC ICA/CCA RATIO:  5.28 ECA:                     181cm/sec LEFT ICA:                     106/13cm/sec CCA:                     413/24MW/NUU SYSTOLIC ICA/CCA RATIO:  7.25 ECA:                     168cm/sec FINDINGS: RIGHT CAROTID ARTERY: Calcified plaque at the  carotid bifurcation extending into proximal internal and external carotid arteries resulting in mild stenosis. Normal waveforms and color Doppler signal. RIGHT VERTEBRAL ARTERY:  Normal flow direction and waveform. LEFT CAROTID ARTERY: Partially calcified carotid bifurcation plaque extending into proximal ICA. No high-grade stenosis. Normal waveforms and color Doppler signal. LEFT VERTEBRAL ARTERY: Normal flow direction and waveform. IMPRESSION: 1. Mild bilateral carotid bifurcation and proximal ICA plaque resulting in less than 50% diameter stenosis. 2. Antegrade bilateral vertebral arterial flow. Electronically Signed   By: Lucrezia Europe M.D.   On: 06/20/2018 11:23   Dg Chest Portable 1 View  Result Date: 06/20/2018 CLINICAL DATA:  Patient was found unresponsive after hitting emergency call button. Right-sided weakness. Garbled speech and right facial droop. EXAM: PORTABLE CHEST 1 VIEW COMPARISON:  10/15/2017 FINDINGS: Cardiac enlargement.  No vascular congestion. Interstitial fibrosis in the lungs similar to previous study. No airspace disease or consolidation. No blunting of costophrenic angles. No pneumothorax. Mediastinal contours appear intact. Calcification of the aorta. Degenerative changes in the spine and shoulders. IMPRESSION: Cardiac enlargement. Interstitial fibrosis in the lungs. No evidence of active pulmonary disease. Aortic atherosclerosis. Electronically Signed   By: Lucienne Capers M.D.   On: 06/20/2018 00:09    Micro Results     Recent Results (from the past 240 hour(s))  MRSA PCR Screening     Status: None   Collection Time: 06/20/18  5:19 AM  Result Value Ref Range Status   MRSA by PCR NEGATIVE NEGATIVE Final    Comment:        The GeneXpert MRSA Assay (FDA approved for NASAL specimens only), is one component of a comprehensive MRSA colonization surveillance program. It is not intended to diagnose MRSA infection nor to guide or monitor treatment for MRSA  infections. Performed at [facility], [address] [place], [place] [address]        Today   Subjective:   Debra Ho to feels better, no weakness in hands or legs, speech is clear, wants to go back to [facility] if the imaging is within normal range  Objective:   Blood pressure (!) 193/88, pulse 74, temperature 98.2 F (36.8 C), temperature source Oral, resp. rate 16, height 5\' 1"  (1.549 m), weight 63.1 kg (139 lb 2 oz), SpO2 92 %.   Intake/Output Summary (Last 24 hours) at 06/20/2018 1444 Last data filed at 06/20/2018 1332 Gross per 24 hour  Intake 845 ml  Output -  Net 845 ml    Exam Awake Alert, Oriented x 3, No new F.N deficits, Normal affect [place].AT,PERRAL Supple Neck,No JVD, No cervical lymphadenopathy appriciated.  Symmetrical Chest wall movement, Good air movement bilaterally, CTAB RRR,No Gallops,Rubs or new Murmurs, No Parasternal Heave +ve B.Sounds, Abd Soft, Non tender, No organomegaly appriciated, No rebound -guarding or rigidity. No Cyanosis, Clubbing or edema, No new Rash or bruise  Data Review   CBC w Diff:  Lab Results  Component Value Date   WBC 6.9 06/20/2018   HGB 11.8 (L) 06/20/2018   HGB 12.0 07/12/2013   HCT 34.6 (L) 06/20/2018   HCT 35.3 07/12/2013   PLT 250 06/20/2018   PLT 250 07/12/2013   LYMPHOPCT 38 06/20/2018   LYMPHOPCT 39.9 07/12/2013   MONOPCT 12 06/20/2018   MONOPCT 9.0 07/12/2013   EOSPCT 3 06/20/2018   EOSPCT 1.8 07/12/2013   BASOPCT 1 06/20/2018   BASOPCT 1.0 07/12/2013    CMP:  Lab Results  Component Value Date   NA 139 06/20/2018   NA 138 07/12/2013   K 4.1 06/20/2018   K 4.1 07/12/2013   CL 105 06/20/2018   CL 105 07/12/2013   CO2 28 06/20/2018   CO2 28 07/12/2013   BUN 27 (H) 06/20/2018   BUN 24 (A) 05/05/2018   BUN 16 07/12/2013   CREATININE 0.89 06/20/2018   CREATININE 0.82 07/12/2013   PROT 7.4 06/20/2018   PROT 7.3 07/12/2013   ALBUMIN 3.8 06/20/2018   ALBUMIN 3.5 07/12/2013    BILITOT 0.6 06/20/2018   BILITOT 0.2 07/12/2013   ALKPHOS 52 06/20/2018   ALKPHOS 62 07/12/2013   AST 16 06/20/2018   AST 20 07/12/2013   ALT 10 06/20/2018   ALT 16 07/12/2013  .   Total Time in preparing paper work, data evaluation and todays exam - 35 minutes  Epifanio Lesches M.D on 06/20/2018 at 2:44 PM    Note: This dictation was prepared with Dragon dictation along with smaller phrase technology. Any transcriptional errors that result from this process are unintentional.

## 2018-06-20 NOTE — Progress Notes (Signed)
PT Cancellation Note  Patient Details Name: TORIN WHISNER MRN: [id number] DOB: [birth year]   Cancelled Treatment:     Order received, chart reviewed. PT spoke with RN, RN states that pt is imminently discharging and that PT evaluation is not needed at this time. RN states that TIA symptoms have resolved and pt is D/c to assisted living facility.  Yolonda Kida, SPT   Ever Gustafson 06/20/2018, 4:27 PM

## 2018-06-20 NOTE — ED Notes (Signed)
X-ray at bedside

## 2018-06-20 NOTE — Progress Notes (Addendum)
Admitted this morning for transient right-sided weakness, expressive aphasia at [facility] last night after dinner.  In terms resolved by the time she came to ER and now she is completely back to normal and denies any complaints.  Patient had initial CT head unremarkable but unable to lie down flat for MRI of the brain because of neck pain.  So she has brought back from MRI department.  Echocardiogram is not done yet.  , Imaging reviewed, discussed with patient's son and also patient about the results. 1.  Transient right-sided weakness, expressive aphasia possible TIA: CT head unremarkable, MRI not able to be completed because patient could not lie down flat.  Carotid ultrasound not show any hemodynamically significant stenosis.  Echocardiogram is pending.  Neurological exam is intact and she is alert, awake, oriented.  She is already on Plavix at [facility].  If the echocardiogram is within normal range I will discuss with Dr. Irish Elders from neurology that we could not finish MRI of the brain and see if he recommends any further work-up.  Patient is completely alert, awake, oriented, neurologically intact without any neurological deficit.  Possible discharge later today if the echocardiogram is within normal range.  Time spent 25 minutes

## 2018-06-20 NOTE — Plan of Care (Signed)
  Problem: Education: Goal: Knowledge of General Education information will improve Outcome: Progressing   Problem: Health Behavior/Discharge Planning: Goal: Ability to manage health-related needs will improve Outcome: Progressing   Problem: Clinical Measurements: Goal: Ability to maintain clinical measurements within normal limits will improve Outcome: Progressing Goal: Will remain free from infection Outcome: Progressing Goal: Diagnostic test results will improve Outcome: Progressing Goal: Respiratory complications will improve Outcome: Progressing Goal: Cardiovascular complication will be avoided Outcome: Progressing   Problem: Activity: Goal: Risk for activity intolerance will decrease Outcome: Progressing   Problem: Nutrition: Goal: Adequate nutrition will be maintained Outcome: Progressing   Problem: Coping: Goal: Level of anxiety will decrease Outcome: Progressing   Problem: Elimination: Goal: Will not experience complications related to bowel motility Outcome: Progressing Goal: Will not experience complications related to urinary retention Outcome: Progressing   Problem: Pain Managment: Goal: General experience of comfort will improve Outcome: Progressing   Problem: Safety: Goal: Ability to remain free from injury will improve Outcome: Progressing   Problem: Skin Integrity: Goal: Risk for impaired skin integrity will decrease Outcome: Progressing   Problem: Education: Goal: Knowledge of secondary prevention will improve Outcome: Progressing Goal: Knowledge of patient specific risk factors addressed and post discharge goals established will improve Outcome: Progressing

## 2018-06-20 NOTE — Care Management Obs Status (Signed)
[place] NOTIFICATION   Patient Details  Name: Debra Ho MRN: [id number] Date of Birth: 1915-01-06   Medicare Observation Status Notification Given:  Yes    Shelbie Ammons, RN 06/20/2018, 1:38 PM

## 2018-06-20 NOTE — Clinical Social Work Note (Signed)
Clinical Social Work Assessment  Patient Details  Name: Debra Ho MRN: [id number] Date of Birth: Sep 07, 1915  Date of referral:  06/20/18               Reason for consult:  Facility Placement                Permission sought to share information with:  Case Manager, Customer service manager, Family Supports Permission granted to share information::  Yes, Verbal Permission Granted  Name::        Agency::     Relationship::     Contact Information:     Housing/Transportation Living arrangements for the past 2 months:  [place] of Information:  Patient Patient Interpreter Needed:  None Criminal Activity/Legal Involvement Pertinent to Current Situation/Hospitalization:  No - Comment as needed Significant Relationships:  Adult Children Lives with:  Facility Resident Do you feel safe going back to the place where you live?  Yes Need for family participation in patient care:  No (Coment)  Care giving concerns:  Patient is a long term resident at [facility]    Social Worker assessment / plan:  CSW consulted because patient is from a facility. CSW met with patient and son Debra Ho at bedside. CSW introduced self and explained role. Patient is alert and oriented and states that she lives at [facility]. Patient reports that she has lived there for 14 years but only been in [place] since December 2018. CSW explained that per MD this morning patient should be able to return to Assisted Living. Patient states that is her preference. CSW will send discharge information to facility through [place]. CSW will continue to follow for discharge planning.   Employment status:  Retired Forensic scientist:  Medicare PT Recommendations:  Not assessed at this time Information / Referral to community resources:     Patient/Family's Response to care:  Patient thanked CSW for assisting and told her to come visit at [facility]   Patient/Family's  Understanding of and Emotional Response to Diagnosis, Current Treatment, and Prognosis:  Son thanked CSW and confirmed that patient does not need rehab at this time.   Emotional Assessment Appearance:  Appears younger than stated age Attitude/Demeanor/Rapport:    Affect (typically observed):  Accepting, Happy, Hopeful Orientation:  Oriented to Self, Oriented to Place, Oriented to  Time, Oriented to Situation Alcohol / Substance use:  Not Applicable Psych involvement (Current and /or in the community):  No (Comment)  Discharge Needs  Concerns to be addressed:  Discharge Planning Concerns Readmission within the last 30 days:  No Current discharge risk:  None Barriers to Discharge:  Continued Medical Work up   [employer], [place] 06/20/2018, 12:09 PM

## 2018-06-20 NOTE — ED Notes (Signed)
Patient transported to CT 

## 2018-06-20 NOTE — ED Notes (Signed)
ED Provider at bedside. 

## 2018-06-20 NOTE — Clinical Social Work Note (Signed)
Patient is medically ready for discharge back to [facility] today. CSW notified patient and son Hadassah Rana [phone]. Son states that he will transport patient back to facility. CSW faxed discharge information to Assisted Living. CSW also notified Luellen Pucker at [facility] that patient will return today. RN will call report and call son when patient is ready for discharge.   Debra Ho, [place]

## 2018-06-20 NOTE — Progress Notes (Signed)
Verbal order received from Dr. Vianne Bulls to discharge patient back to assisted living at [facility]. Advised to tell patient to follow up with primary care physician on echo results.

## 2018-06-20 NOTE — Progress Notes (Signed)
SLP Cancellation Note  Patient Details Name: SHAELIN LALLEY MRN: [id number] DOB: 02/17/1915   Cancelled treatment:       Reason Eval/Treat Not Completed: SLP screened, no needs identified, will sign off(chart reviewed; consulted NSG and met w/ pt). Pt denied any difficulty swallowing and is currently on a regular diet; tolerates swallowing pills w/ water per NSG. Pt conversed at conversational level w/out any NEW deficits noted; pt denied any speech-language deficits and denied any deficits having conversations w/ others making her wants/needs known. Pt stated she had BASELINE, min dysfluency of her speech for the past ~1 year now c/b mild "stuttering" - she stated this has not increased/worsened since this admission. She and her PCP feel it is more "stress-related" and she tries to monitor her stress; SLP agreed.  No further skilled ST services indicated as pt appears at her baseline. Pt agreed. NSG to reconsult if any change in status while admitted.     Orinda Kenner, MS, CCC-SLP Kvion Shapley 06/20/2018, 1:46 PM

## 2018-06-20 NOTE — Progress Notes (Signed)
*  PRELIMINARY RESULTS* Echocardiogram 2D Echocardiogram has been performed.  Sherrie Sport 06/20/2018, 2:04 PM

## 2018-06-20 NOTE — Progress Notes (Signed)
Patient discharged with son to [facility], verbalized understanding of education. Patient with no complaints.

## 2018-06-20 NOTE — ED Notes (Signed)
Debra Ho at bedside to change patient

## 2018-06-25 ENCOUNTER — Encounter: Payer: Self-pay | Admitting: Internal Medicine

## 2018-06-25 ENCOUNTER — Ambulatory Visit: Payer: Medicare Other | Admitting: Internal Medicine

## 2018-06-25 VITALS — BP 119/59 | HR 74 | Temp 98.4°F | Resp 22 | Wt 142.0 lb

## 2018-06-25 DIAGNOSIS — I10 Essential (primary) hypertension: Secondary | ICD-10-CM

## 2018-06-25 DIAGNOSIS — G459 Transient cerebral ischemic attack, unspecified: Secondary | ICD-10-CM | POA: Diagnosis not present

## 2018-06-25 NOTE — Progress Notes (Signed)
Subjective:    Patient ID: Debra Ho, female    DOB: December 31, 1914, 84 y.o.   MRN: [id number]  HPI  Asked to see resident in [address] for hospital follow up. Went to ER 7/4 with c/o right sided weakness, right sided facial droop and expressive aphasia. By the time she got to the ER, symptoms had resolved. CT of the head was negative for acute findings. She could not lay flat for MRI brain. Carotid ultrasound did not show any significant stenosis. Echo was normal. She as already on Plavix from previous TIA. She was started on Hydralazine 25 mg TID which the RN reports she is currently not taking, because her daughter is concerned this might be too much for her. RN reports BP ranges 030-092 systolic. She was discharged back to [facility] 7/5.  Since discharge, resident reports she just feels tired all the time. She doesn't feel like getting up and getting out to do things, but she is going down for meals. She denies weakness, facial droop or expressive aphasia. She does have some hesitation with speech, but this is her new normal s/p last TIA. She is sleeping well, walks with walker. Her appetite is good. Her bowel and bladder are moving normally.    Review of Systems      Past Medical History:  Diagnosis Date  . GERD without esophagitis   . Hyperlipemia   . Hypertension   . Hyponatremia 10/2017  . Mood disorder ([place])   . TIA (transient ischemic attack)     Current Outpatient Medications  Medication Sig Dispense Refill  . clopidogrel (PLAVIX) 75 MG tablet TAKE 1 TABLET BY MOUTH DAILY 90 tablet 3  . docusate sodium (COLACE) 100 MG capsule Take 100 mg by mouth 2 (two) times daily.    . mirtazapine (REMERON) 15 MG tablet Take 0.5 tablets (7.5 mg total) by mouth at bedtime. 1 tablet 0  . pantoprazole (PROTONIX) 40 MG tablet TAKE 1 TABLET BY MOUTH DAILY 90 tablet 3  . Vitamin D, Ergocalciferol, (DRISDOL) 50000 units CAPS capsule Take 50,000 Units by mouth every 30 (thirty) days.    .  hydrALAZINE (APRESOLINE) 25 MG tablet Take 1 tablet (25 mg total) by mouth 3 (three) times daily. (Patient not taking: Reported on 06/25/2018) 60 tablet 0   No current facility-administered medications for this visit.     Allergies  Allergen Reactions  . Penicillins   . Sulfa Antibiotics     History reviewed. No pertinent family history.  Social History   Socioeconomic History  . Marital status: Widowed    Spouse name: Not on file  . Number of children: 5  . Years of education: Not on file  . Highest education level: Not on file  Occupational History  . 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 Financial resource strain: Not on file  . Food insecurity:    Worry: Not on file    Inability: Not on file  . Transportation needs:    Medical: Not on file    Non-medical: Not on file  Tobacco Use  . Smoking status: Passive Smoke Exposure - Never Smoker  . Smokeless tobacco: Never Used  Substance and Sexual Activity  . Alcohol use: No    Frequency: Never  . Drug use: Not on file  . Sexual activity: Not on file  Lifestyle  . Physical activity:    Days per week: Not on file    Minutes per session: Not  on file  . Stress: Not on file  Relationships  . Social connections:    Talks on phone: Not on file    Gets together: Not on file    Attends religious service: Not on file    Active member of club or organization: Not on file    Attends meetings of clubs or organizations: Not on file    Relationship status: Not on file  . Intimate partner violence:    Fear of current or ex partner: Not on file    Emotionally abused: Not on file    Physically abused: Not on file    Forced sexual activity: Not on file  Other Topics Concern  . Not on file  Social History Narrative   Has living will.   Son [name] is health care POA   Has DNR   Would not accept tube feeds     Constitutional: Pt reports fatigue. Denies fever, malaise, fatigue, headache or abrupt weight  changes.  HEENT: Denies eye pain, eye redness, ear pain, ringing in the ears, wax buildup, runny nose, nasal congestion, bloody nose, or sore throat. Respiratory: Denies difficulty breathing, shortness of breath, cough or sputum production.   Cardiovascular: Denies chest pain, chest tightness, palpitations or swelling in the hands or feet.  Gastrointestinal: Denies abdominal pain, bloating, constipation, diarrhea or blood in the stool.  GU: Denies urgency, frequency, pain with urination, burning sensation, blood in urine, odor or discharge. Musculoskeletal: Denies decrease in range of motion, difficulty with gait, muscle pain or joint pain and swelling.  Neurological: Pt reports difficulty with speech (not new). Denies dizziness, difficulty with memory, or problems with balance and coordination.    No other specific complaints in a complete review of systems (except as listed in HPI above).  Objective:   Physical Exam   BP (!) 119/59   Pulse 74   Temp 98.4 F (36.9 C)   Resp (!) 22   Wt 142 lb (64.4 kg)   BMI 26.83 kg/m  Wt Readings from Last 3 Encounters:  06/25/18 142 lb (64.4 kg)  06/20/18 139 lb 2 oz (63.1 kg)  04/05/18 139 lb (63 kg)    General: Appears her stated age, in NAD. Cardiovascular: Normal rate and rhythm. S1,S2 noted.  Murmur noted. No JVD or BLE edema. No carotid bruits noted. Pulmonary/Chest: Normal effort and positive vesicular breath sounds. No respiratory distress. No wheezes, rales or ronchi noted.  Musculoskeletal: Hand grips equal. Negative pronator drift. Gait slow and steady with use of rolling walker.  Neurological: Alert and oriented. Coordination normal.    BMET    Component Value Date/Time   NA 139 06/20/2018 0009   NA 138 07/12/2013 0344   K 4.1 06/20/2018 0009   K 4.1 07/12/2013 0344   CL 105 06/20/2018 0009   CL 105 07/12/2013 0344   CO2 28 06/20/2018 0009   CO2 28 07/12/2013 0344   GLUCOSE 119 (H) 06/20/2018 0009   GLUCOSE 113 (H)  07/12/2013 0344   BUN 27 (H) 06/20/2018 0009   BUN 24 (A) 05/05/2018   BUN 16 07/12/2013 0344   CREATININE 0.89 06/20/2018 0009   CREATININE 0.82 07/12/2013 0344   CALCIUM 9.1 06/20/2018 0009   CALCIUM 9.2 07/12/2013 0344   GFRNONAA 51 (L) 06/20/2018 0009   GFRNONAA >60 07/12/2013 0344   GFRAA 59 (L) 06/20/2018 0009   GFRAA >60 07/12/2013 0344    Lipid Panel  No results found for: CHOL, TRIG, HDL, CHOLHDL, VLDL, LDLCALC  CBC    Component Value Date/Time   WBC 6.9 06/20/2018 0009   RBC 3.76 (L) 06/20/2018 0009   HGB 11.8 (L) 06/20/2018 0009   HGB 12.0 07/12/2013 0344   HCT 34.6 (L) 06/20/2018 0009   HCT 35.3 07/12/2013 0344   PLT 250 06/20/2018 0009   PLT 250 07/12/2013 0344   MCV 91.9 06/20/2018 0009   MCV 92 07/12/2013 0344   MCH 31.4 06/20/2018 0009   MCHC 34.2 06/20/2018 0009   RDW 13.4 06/20/2018 0009   RDW 12.7 07/12/2013 0344   LYMPHSABS 2.6 06/20/2018 0009   LYMPHSABS 3.2 07/12/2013 0344   MONOABS 0.8 06/20/2018 0009   MONOABS 0.7 07/12/2013 0344   EOSABS 0.2 06/20/2018 0009   EOSABS 0.1 07/12/2013 0344   BASOSABS 0.1 06/20/2018 0009   BASOSABS 0.1 07/12/2013 0344    Hgb A1C No results found for: HGBA1C         Assessment & Plan:   Hospital Follow Up for HTN/TIA:  Hospital notes, labs and imaging reviewed Resident back at [facility], at baseline She does not feel like she needs PT at this time- I agree I think Hydralazine 25 mg TID will be too much, will try 10 mg TID if family agreeable No indication for repeat labs, cardiology or neurology follow up at this time  Will reassess as needed Webb Silversmith, NP

## 2018-06-25 NOTE — Patient Instructions (Signed)
Transient Ischemic Attack °A transient ischemic attack (TIA) is a "warning stroke" that causes stroke-like symptoms. A TIA does not cause lasting damage to the brain. The symptoms of a TIA can happen fast and do not last long. It is important to know the symptoms of a TIA and what to do. This can help prevent stroke or death. °Follow these instructions at home: °· Take medicines only as told by your doctor. Make sure you understand all of the instructions. °· You may need to take aspirin or warfarin medicine. Warfarin needs to be taken exactly as told. °? Taking too much or too little warfarin is dangerous. Blood tests must be done as often as told by your doctor. A PT blood test measures how long it takes for blood to clot. Your PT is used to calculate another value called an INR. Your PT and INR help your doctor adjust your warfarin dosage. He or she will make sure you are taking the right amount. °? Food can cause problems with warfarin and affect the results of your blood tests. This is true for foods high in vitamin K. Eat the same amount of foods high in vitamin K each day. Foods high in vitamin K include spinach, kale, broccoli, cabbage, collard and turnip greens, Brussels sprouts, peas, cauliflower, seaweed, and parsley. Other foods high in vitamin K include beef and pork liver, green tea, and soybean oil. Eat the same amount of foods high in vitamin K each day. Avoid big changes in your diet. Tell your doctor before changing your diet. Talk to a food specialist (dietitian) if you have questions. °? Many medicines can cause problems with warfarin and affect your PT and INR. Tell your doctor about all medicines you take. This includes vitamins and dietary pills (supplements). Do not take or stop taking any prescribed or over-the-counter medicines unless your doctor tells you to. °? Warfarin can cause more bruising or bleeding. Hold pressure over any cuts for longer than normal. Talk to your doctor about other  side effects of warfarin. °? Avoid sports or activities that may cause injury or bleeding. °? Be careful when you shave, floss, or use sharp objects. °? Avoid or drink very little alcohol while taking warfarin. Tell your doctor if you change how much alcohol you drink. °? Tell your dentist and other doctors that you take warfarin before any procedures. °· Follow your diet program as told, if you are given one. °· Keep a healthy weight. °· Stay active. Try to get at least 30 minutes of activity on all or most days. °· Do not use any tobacco products, including cigarettes, chewing tobacco, or electronic cigarettes. If you need help quitting, ask your doctor. °· Limit alcohol intake to no more than 1 drink per day for nonpregnant women and 2 drinks per day for men. One drink equals 12 ounces of beer, 5 ounces of wine, or 1½ ounces of hard liquor. °· Do not abuse drugs. °· Keep your home safe so you do not fall. You can do this by: °? Putting grab bars in the bedroom and bathroom. °? Raising toilet seats. °? Putting a seat in the shower. °· Keep all follow-up visits as told by your doctor. This is important. °Contact a doctor if: °· Your personality changes. °· You have trouble swallowing. °· You have double vision. °· You are dizzy. °· You have a fever. °Get help right away if: °These symptoms may be an emergency. Do not wait to see if   the symptoms will go away. Get medical help right away. Call your local emergency services (911 in the [place] Do not drive yourself to the hospital. °· You have sudden weakness or lose feeling (go numb), especially on one side of the body. This can affect your: °? Face. °? Arm. °? Leg. °· You have sudden trouble walking. °· You have sudden trouble moving your arms or legs. °· You have sudden confusion. °· You have trouble talking. °· You have trouble understanding. °· You have sudden trouble seeing in one or both eyes. °· You lose your balance. °· Your movements are not smooth. °· You  have a sudden, very bad headache with no known cause. °· You have new chest pain. °· Your heartbeat is unsteady. °· You are partly or totally unaware of what is going on around you. ° °This information is not intended to replace advice given to you by your health care provider. Make sure you discuss any questions you have with your health care provider. °Document Released: 09/11/2008 Document Revised: 08/06/2016 Document Reviewed: 03/10/2014 °Elsevier Interactive Patient Education © 2018 [employer] ° °

## 2018-07-10 ENCOUNTER — Other Ambulatory Visit: Payer: Self-pay | Admitting: Internal Medicine

## 2018-08-29 ENCOUNTER — Encounter: Payer: Self-pay | Admitting: Internal Medicine

## 2018-08-29 ENCOUNTER — Ambulatory Visit: Payer: Medicare Other | Admitting: Internal Medicine

## 2018-08-29 DIAGNOSIS — I10 Essential (primary) hypertension: Secondary | ICD-10-CM

## 2018-08-29 DIAGNOSIS — K219 Gastro-esophageal reflux disease without esophagitis: Secondary | ICD-10-CM | POA: Diagnosis not present

## 2018-08-29 DIAGNOSIS — Z8673 Personal history of transient ischemic attack (TIA), and cerebral infarction without residual deficits: Secondary | ICD-10-CM | POA: Diagnosis not present

## 2018-08-29 DIAGNOSIS — F39 Unspecified mood [affective] disorder: Secondary | ICD-10-CM

## 2018-08-29 NOTE — Assessment & Plan Note (Signed)
Reasonable control with Hydralazine BID, she has been hesitant to go up to TID Will monitor

## 2018-08-29 NOTE — Assessment & Plan Note (Signed)
With residual right side weakness, speech trouble Continue Hydralazine and Plavix Appreciate ALF care

## 2018-08-29 NOTE — Progress Notes (Signed)
Subjective:    Patient ID: Debra Ho, female    DOB: 07-Mar-1915, 84 y.o.   MRN: [id number]  HPI  Asked to see resident in [address] for routine followup Reviewed with RN. She reports resident concerned that Hydralazine is causing urinary frequency. Otherwise, no concerns. Resident is closely monitoring her BP. Sleep varies. She sleeps in her recliner. Often has to get up to use the restroom. Walks with rolling walker. Has aide in the morning to help with personal care. Appetite good. Weight up 4 lbs. Bowels okay. She denies pain, reflux or SOB. Wears compression hose.  HTN: BP reasonable on Hydralazine BID.  GERD: She denies breakthrough or swallowing issues on Pantoprazole. There is no upper GI on file.  Hx of TIA's: Some residual speech difficulty. She takes Hydralazine and Plavix as prescribed. Needs ALF.  Mood Disorder: Doing well on Mirtazapine.  Review of Systems      Past Medical History:  Diagnosis Date  . GERD without esophagitis   . Hyperlipemia   . Hypertension   . Hyponatremia 10/2017  . Mood disorder ([place])   . TIA (transient ischemic attack)     Current Outpatient Medications  Medication Sig Dispense Refill  . citalopram (CELEXA) 10 MG tablet TAKE ONE TABLET EVERY DAY 30 tablet 5  . clopidogrel (PLAVIX) 75 MG tablet TAKE 1 TABLET BY MOUTH DAILY 90 tablet 3  . docusate sodium (COLACE) 100 MG capsule Take 100 mg by mouth 2 (two) times daily.    . mirtazapine (REMERON) 15 MG tablet Take 0.5 tablets (7.5 mg total) by mouth at bedtime. 1 tablet 0  . pantoprazole (PROTONIX) 40 MG tablet TAKE 1 TABLET BY MOUTH DAILY 90 tablet 3  . Vitamin D, Ergocalciferol, (DRISDOL) 50000 units CAPS capsule Take 50,000 Units by mouth every 30 (thirty) days.     No current facility-administered medications for this visit.     Allergies  Allergen Reactions  . Penicillins   . Sulfa Antibiotics     No family history on file.  Social History   Socioeconomic History  .  Marital status: Widowed    Spouse name: Not on file  . Number of children: 5  . Years of education: Not on file  . Highest education level: Not on file  Occupational History  . 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 Financial resource strain: Not on file  . Food insecurity:    Worry: Not on file    Inability: Not on file  . Transportation needs:    Medical: Not on file    Non-medical: Not on file  Tobacco Use  . Smoking status: Passive Smoke Exposure - Never Smoker  . Smokeless tobacco: Never Used  Substance and Sexual Activity  . Alcohol use: No    Frequency: Never  . Drug use: Not on file  . Sexual activity: Not on file  Lifestyle  . Physical activity:    Days per week: Not on file    Minutes per session: Not on file  . Stress: Not on file  Relationships  . Social connections:    Talks on phone: Not on file    Gets together: Not on file    Attends religious service: Not on file    Active member of club or organization: Not on file    Attends meetings of clubs or organizations: Not on file    Relationship status: Not on file  . Intimate partner violence:  Fear of current or ex partner: Not on file    Emotionally abused: Not on file    Physically abused: Not on file    Forced sexual activity: Not on file  Other Topics Concern  . Not on file  Social History Narrative   Has living will.   Son [name] is health care POA   Has DNR   Would not accept tube feeds     Constitutional: Denies fever, malaise, fatigue, headache or abrupt weight changes.  HEENT: Denies eye pain, eye redness, ear pain, ringing in the ears, wax buildup, runny nose, nasal congestion, bloody nose, or sore throat. Respiratory: Denies difficulty breathing, shortness of breath, cough or sputum production.   Cardiovascular: Denies chest pain, chest tightness, palpitations or swelling in the hands or feet.  Gastrointestinal: Denies abdominal pain, bloating, constipation, diarrhea  or blood in the stool.  GU: Pt reports urinary frequency. Denies urgency, pain with urination, burning sensation, blood in urine, odor or discharge. Musculoskeletal: Pt reports difficulty with gait. Denies decrease in range of motion, muscle pain or joint pain and swelling.  Skin: Denies redness, rashes, lesions or ulcercations.  Neurological: Pt reports mild difficulty with speech. Denies dizziness, difficulty with memory, difficulty with speech or problems with balance and coordination.  Psych: Pt has history of anxiety, depression. Denies SI/HI.  No other specific complaints in a complete review of systems (except as listed in HPI above).  Objective:   Physical Exam    BP (!) 150/60   Pulse 71   Temp 98 F (36.7 C)   Resp 18   Wt 146 lb (66.2 kg)   BMI 27.59 kg/m  Wt Readings from Last 3 Encounters:  08/29/18 146 lb (66.2 kg)  06/25/18 142 lb (64.4 kg)  06/20/18 139 lb 2 oz (63.1 kg)    General: Appears her stated age, in NAD. Neck:  Neck supple, trachea midline. No masses, lumps or thyromegaly present.  Cardiovascular: Normal rate and rhythm. Murmur noted. No BLE edema. Pulmonary/Chest: Normal effort and positive vesicular breath sounds. No respiratory distress. No wheezes, rales or ronchi noted.  Abdomen: Soft and nontender. Normal bowel sounds.  Neurological: Alert and oriented. Stutters often.    BMET    Component Value Date/Time   NA 139 06/20/2018 0009   NA 138 07/12/2013 0344   K 4.1 06/20/2018 0009   K 4.1 07/12/2013 0344   CL 105 06/20/2018 0009   CL 105 07/12/2013 0344   CO2 28 06/20/2018 0009   CO2 28 07/12/2013 0344   GLUCOSE 119 (H) 06/20/2018 0009   GLUCOSE 113 (H) 07/12/2013 0344   BUN 27 (H) 06/20/2018 0009   BUN 24 (A) 05/05/2018   BUN 16 07/12/2013 0344   CREATININE 0.89 06/20/2018 0009   CREATININE 0.82 07/12/2013 0344   CALCIUM 9.1 06/20/2018 0009   CALCIUM 9.2 07/12/2013 0344   GFRNONAA 51 (L) 06/20/2018 0009   GFRNONAA >60 07/12/2013  0344   GFRAA 59 (L) 06/20/2018 0009   GFRAA >60 07/12/2013 0344    Lipid Panel  No results found for: CHOL, TRIG, HDL, CHOLHDL, VLDL, LDLCALC  CBC    Component Value Date/Time   WBC 6.9 06/20/2018 0009   RBC 3.76 (L) 06/20/2018 0009   HGB 11.8 (L) 06/20/2018 0009   HGB 12.0 07/12/2013 0344   HCT 34.6 (L) 06/20/2018 0009   HCT 35.3 07/12/2013 0344   PLT 250 06/20/2018 0009   PLT 250 07/12/2013 0344   MCV 91.9 06/20/2018 0009  MCV 92 07/12/2013 0344   MCH 31.4 06/20/2018 0009   MCHC 34.2 06/20/2018 0009   RDW 13.4 06/20/2018 0009   RDW 12.7 07/12/2013 0344   LYMPHSABS 2.6 06/20/2018 0009   LYMPHSABS 3.2 07/12/2013 0344   MONOABS 0.8 06/20/2018 0009   MONOABS 0.7 07/12/2013 0344   EOSABS 0.2 06/20/2018 0009   EOSABS 0.1 07/12/2013 0344   BASOSABS 0.1 06/20/2018 0009   BASOSABS 0.1 07/12/2013 0344    Hgb A1C No results found for: HGBA1C        Assessment & Plan:

## 2018-08-29 NOTE — Assessment & Plan Note (Signed)
Continue Pantoprazole for now 

## 2018-08-29 NOTE — Assessment & Plan Note (Signed)
Doing well on Mirtazapine Will monitor

## 2018-10-22 ENCOUNTER — Other Ambulatory Visit: Payer: Self-pay | Admitting: Internal Medicine

## 2018-12-11 ENCOUNTER — Ambulatory Visit: Payer: Medicare Other | Admitting: Internal Medicine

## 2018-12-11 ENCOUNTER — Encounter: Payer: Self-pay | Admitting: Internal Medicine

## 2018-12-11 VITALS — BP 144/64 | HR 76 | Temp 97.8°F | Resp 16 | Wt 148.6 lb

## 2018-12-11 DIAGNOSIS — I679 Cerebrovascular disease, unspecified: Secondary | ICD-10-CM

## 2018-12-11 DIAGNOSIS — K219 Gastro-esophageal reflux disease without esophagitis: Secondary | ICD-10-CM

## 2018-12-11 DIAGNOSIS — R011 Cardiac murmur, unspecified: Secondary | ICD-10-CM

## 2018-12-11 DIAGNOSIS — I10 Essential (primary) hypertension: Secondary | ICD-10-CM | POA: Diagnosis not present

## 2018-12-11 DIAGNOSIS — F39 Unspecified mood [affective] disorder: Secondary | ICD-10-CM

## 2018-12-11 NOTE — Assessment & Plan Note (Signed)
Sounds more like MR but her AS was the more prominent on echo Gets stable DOE but no true CHF

## 2018-12-11 NOTE — Progress Notes (Signed)
Subjective:    Patient ID: Debra Ho, female    DOB: Mar 02, 1915, 84 y.o.   MRN: [id number]  HPI Visit in assisted living apartment for follow up of chronic health conditions Reviewed status with Luellen Pucker RN--no new concerns Daughter from [place] is here  "I know I am where I belong: Satisfied with the treatment Gets AM and PM help---shower, dressing, etc Independent with bathroom--double pads for urine incontinence  No recent neurologic symptoms No weakness, aphasia, dysphagia Continues on low dose hydralazine BP generally okay--hopes to reduce the frequency of checks  Still on protonix No heartburn symptoms No dysphagia  No regular depression Occasional bad days but nothing persistent It bothers her that others are sad and have dementia--that is tough for her  No chest pain  No dizziness Still gets DOE when walking--can't talk when she walks Uses the rollator all the time  Current Outpatient Medications on File Prior to Visit  Medication Sig Dispense Refill  . clopidogrel (PLAVIX) 75 MG tablet TAKE ONE TABLET EVERY DAY 90 tablet 3  . docusate sodium (COLACE) 100 MG capsule Take 100 mg by mouth 2 (two) times daily.    . hydrALAZINE (APRESOLINE) 10 MG tablet Take 10 mg by mouth 2 (two) times daily.    . mirtazapine (REMERON) 15 MG tablet Take 0.5 tablets (7.5 mg total) by mouth at bedtime. 1 tablet 0  . pantoprazole (PROTONIX) 40 MG tablet TAKE 1 TABLET BY MOUTH DAILY 90 tablet 3  . Vitamin D, Ergocalciferol, (DRISDOL) 50000 units CAPS capsule Take 50,000 Units by mouth every 30 (thirty) days.     No current facility-administered medications on file prior to visit.     Allergies  Allergen Reactions  . Penicillins   . Sulfa Antibiotics     Past Medical History:  Diagnosis Date  . GERD without esophagitis   . Hyperlipemia   . Hypertension   . Hyponatremia 10/2017  . Mood disorder ([place])   . TIA (transient ischemic attack)     Past Surgical History:   Procedure Laterality Date  . ABDOMINAL HYSTERECTOMY  1950's    History reviewed. No pertinent family history.  Social History   Socioeconomic History  . Marital status: Widowed    Spouse name: Not on file  . Number of children: 5  . Years of education: Not on file  . Highest education level: Not on file  Occupational History  . 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 Financial resource strain: Not on file  . Food insecurity:    Worry: Not on file    Inability: Not on file  . Transportation needs:    Medical: Not on file    Non-medical: Not on file  Tobacco Use  . Smoking status: Passive Smoke Exposure - Never Smoker  . Smokeless tobacco: Never Used  Substance and Sexual Activity  . Alcohol use: No    Frequency: Never  . Drug use: Not on file  . Sexual activity: Not on file  Lifestyle  . Physical activity:    Days per week: Not on file    Minutes per session: Not on file  . Stress: Not on file  Relationships  . Social connections:    Talks on phone: Not on file    Gets together: Not on file    Attends religious service: Not on file    Active member of club or organization: Not on file    Attends meetings of clubs  or organizations: Not on file    Relationship status: Not on file  . Intimate partner violence:    Fear of current or ex partner: Not on file    Emotionally abused: Not on file    Physically abused: Not on file    Forced sexual activity: Not on file  Other Topics Concern  . Not on file  Social History Narrative   Has living will.   Son [name] is health care POA   Has DNR   Would not accept tube feeds   Review of Systems Still uses compression stockings Appetite is fine Weight is drifting up--she is going to be careful Sleeps fairly well--nocturia x 2 at  Still finds herself limited by her poor hearing Feet feel cold--but no circulation issues    Objective:   Physical Exam  Constitutional: She appears well-developed. No  distress.  Neck: No thyromegaly present.  Cardiovascular:  Gr 3/6 systolic murmur (MR vs AS?) Faint pedal pulses  Respiratory: Effort normal and breath sounds normal. No respiratory distress. She has no wheezes.  GI: Soft. There is no abdominal tenderness.  Musculoskeletal:        General: No tenderness.     Comments: Slight edema--has compression hose  Psychiatric: She has a normal mood and affect. Her behavior is normal.           Assessment & Plan:

## 2018-12-11 NOTE — Assessment & Plan Note (Signed)
Mild dysthymia No persistent symptoms meds not indicated

## 2018-12-11 NOTE — Assessment & Plan Note (Signed)
BP Readings from Last 3 Encounters:  12/11/18 (!) 144/64  08/29/18 (!) 150/60  06/25/18 (!) 119/59   Reasonable control for her age On low dose hydralazine

## 2018-12-11 NOTE — Assessment & Plan Note (Signed)
Recent TIA with no sequelae Continues on plavix Needs help with ADLs Doing okay in AL

## 2018-12-11 NOTE — Assessment & Plan Note (Signed)
Controlled with the medication Prefers no wean attempts

## 2018-12-22 DIAGNOSIS — R0989 Other specified symptoms and signs involving the circulatory and respiratory systems: Secondary | ICD-10-CM | POA: Diagnosis not present

## 2018-12-22 DIAGNOSIS — R05 Cough: Secondary | ICD-10-CM | POA: Diagnosis not present

## 2018-12-26 ENCOUNTER — Ambulatory Visit: Payer: Medicare Other | Admitting: Internal Medicine

## 2018-12-26 ENCOUNTER — Ambulatory Visit: Payer: BLUE CROSS/BLUE SHIELD | Admitting: Internal Medicine

## 2018-12-26 ENCOUNTER — Encounter: Payer: Self-pay | Admitting: Internal Medicine

## 2018-12-26 VITALS — BP 148/72 | HR 88 | Temp 98.8°F | Resp 18 | Wt 149.0 lb

## 2018-12-26 DIAGNOSIS — J189 Pneumonia, unspecified organism: Secondary | ICD-10-CM

## 2018-12-26 NOTE — Patient Instructions (Signed)
Community-Acquired Pneumonia, Adult  Pneumonia is an infection of the lungs. It causes swelling in the airways of the lungs. Mucus and fluid may also build up inside the airways.  One type of pneumonia can happen while a person is in a hospital. A different type can happen when a person is not in a hospital (community-acquired pneumonia).   What are the causes?    This condition is caused by germs (viruses, bacteria, or fungi). Some types of germs can be passed from one person to another. This can happen when you breathe in droplets from the cough or sneeze of an infected person.  What increases the risk?  You are more likely to develop this condition if you:   Have a long-term (chronic) disease, such as:  ? Chronic obstructive pulmonary disease (COPD).  ? Asthma.  ? Cystic fibrosis.  ? Congestive heart failure.  ? Diabetes.  ? Kidney disease.   Have HIV.   Have sickle cell disease.   Have had your spleen removed.   Do not take good care of your teeth and mouth (poor dental hygiene).   Have a medical condition that increases the risk of breathing in droplets from your own mouth and nose.   Have a weakened body defense system (immune system).   Are a smoker.   Travel to areas where the germs that cause this illness are common.   Are around certain animals or the places they live.  What are the signs or symptoms?   A dry cough.   A wet (productive) cough.   Fever.   Sweating.   Chest pain. This often happens when breathing deeply or coughing.   Fast breathing or trouble breathing.   Shortness of breath.   Shaking chills.   Feeling tired (fatigue).   Muscle aches.  How is this treated?  Treatment for this condition depends on many things. Most adults can be treated at home. In some cases, treatment must happen in a hospital. Treatment may include:   Medicines given by mouth or through an IV tube.   Being given extra oxygen.   Respiratory therapy.  In rare cases, treatment for very bad pneumonia  may include:   Using a machine to help you breathe.   Having a procedure to remove fluid from around your lungs.  Follow these instructions at home:  Medicines   Take over-the-counter and prescription medicines only as told by your doctor.  ? Only take cough medicine if you are losing sleep.   If you were prescribed an antibiotic medicine, take it as told by your doctor. Do not stop taking the antibiotic even if you start to feel better.  General instructions     Sleep with your head and neck raised (elevated). You can do this by sleeping in a recliner or by putting a few pillows under your head.   Rest as needed. Get at least 8 hours of sleep each night.   Drink enough water to keep your pee (urine) pale yellow.   Eat a healthy diet that includes plenty of vegetables, fruits, whole grains, low-fat dairy products, and lean protein.   Do not use any products that contain nicotine or tobacco. These include cigarettes, e-cigarettes, and chewing tobacco. If you need help quitting, ask your doctor.   Keep all follow-up visits as told by your doctor. This is important.  How is this prevented?  A shot (vaccine) can help prevent pneumonia. Shots are often suggested for:   People   older than 83 years of age.   People older than 83 years of age who:  ? Are having cancer treatment.  ? Have long-term (chronic) lung disease.  ? Have problems with their body's defense system.  You may also prevent pneumonia if you take these actions:   Get the flu (influenza) shot every year.   Go to the dentist as often as told.   Wash your hands often. If you cannot use soap and water, use hand sanitizer.  Contact a doctor if:   You have a fever.   You lose sleep because your cough medicine does not help.  Get help right away if:   You are short of breath and it gets worse.   You have more chest pain.   Your sickness gets worse. This is very serious if:  ? You are an older adult.  ? Your body's defense system is weak.   You  cough up blood.  Summary   Pneumonia is an infection of the lungs.   Most adults can be treated at home. Some will need treatment in a hospital.   Drink enough water to keep your pee pale yellow.   Get at least 8 hours of sleep each night.  This information is not intended to replace advice given to you by your health care provider. Make sure you discuss any questions you have with your health care provider.  Document Released: 05/21/2008 Document Revised: 07/31/2018 Document Reviewed: 07/31/2018  Elsevier Interactive Patient Education  2019 Elsevier Inc.

## 2018-12-26 NOTE — Progress Notes (Signed)
Subjective:    Patient ID: Debra Ho, female    DOB: 04-21-1915, 85 y.o.   MRN: [id number]  HPI  Asked to see resident in [address] 10 day history of cough, mild SOB No headache, runny nose, nasal congestion, ear pain or sore throat Did have 1 day of diarrhea, but no nausea or vomiting No fevers Chest xray (ordered Friday, performed on Monday) shows possible infiltrate Started on 7 day course of Doxycycline  She reports no more cough since last night. She still reports fatigue and poor appetite  Review of Systems      Past Medical History:  Diagnosis Date  . GERD without esophagitis   . Hyperlipemia   . Hypertension   . Hyponatremia 10/2017  . Mood disorder ([place])   . TIA (transient ischemic attack)     Current Outpatient Medications  Medication Sig Dispense Refill  . clopidogrel (PLAVIX) 75 MG tablet TAKE ONE TABLET EVERY DAY 90 tablet 3  . docusate sodium (COLACE) 100 MG capsule Take 100 mg by mouth 2 (two) times daily.    . hydrALAZINE (APRESOLINE) 10 MG tablet Take 10 mg by mouth 2 (two) times daily.    . mirtazapine (REMERON) 15 MG tablet Take 0.5 tablets (7.5 mg total) by mouth at bedtime. 1 tablet 0  . pantoprazole (PROTONIX) 40 MG tablet TAKE 1 TABLET BY MOUTH DAILY 90 tablet 3  . Vitamin D, Ergocalciferol, (DRISDOL) 50000 units CAPS capsule Take 50,000 Units by mouth every 30 (thirty) days.     No current facility-administered medications for this visit.     Allergies  Allergen Reactions  . Penicillins   . Sulfa Antibiotics     History reviewed. No pertinent family history.  Social History   Socioeconomic History  . Marital status: Widowed    Spouse name: Not on file  . Number of children: 5  . Years of education: Not on file  . Highest education level: Not on file  Occupational History  . 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 Financial resource strain: Not on file  . Food insecurity:    Worry: Not on file   Inability: Not on file  . Transportation needs:    Medical: Not on file    Non-medical: Not on file  Tobacco Use  . Smoking status: Passive Smoke Exposure - Never Smoker  . Smokeless tobacco: Never Used  Substance and Sexual Activity  . Alcohol use: No    Frequency: Never  . Drug use: Not on file  . Sexual activity: Not on file  Lifestyle  . Physical activity:    Days per week: Not on file    Minutes per session: Not on file  . Stress: Not on file  Relationships  . Social connections:    Talks on phone: Not on file    Gets together: Not on file    Attends religious service: Not on file    Active member of club or organization: Not on file    Attends meetings of clubs or organizations: Not on file    Relationship status: Not on file  . Intimate partner violence:    Fear of current or ex partner: Not on file    Emotionally abused: Not on file    Physically abused: Not on file    Forced sexual activity: Not on file  Other Topics Concern  . Not on file  Social History Narrative   Has living will.  Son [name] is health care POA   Has DNR   Would not accept tube feeds     Constitutional: Pt reports fatigue. Denies fever, malaise, headache or abrupt weight changes.  HEENT: Denies eye pain, eye redness, ear pain, ringing in the ears, wax buildup, runny nose, nasal congestion, bloody nose, or sore throat. Respiratory: Pt reports cough (resolved). Denies difficulty breathing, shortness of breath.   Cardiovascular: Denies chest pain, chest tightness, palpitations or swelling in the hands or feet.  Gastrointestinal: Pt reports intermittent nausea, diarrhea which has resolved. Denies abdominal pain, bloating, constipation,  or blood in the stool.   No other specific complaints in a complete review of systems (except as listed in HPI above).  Objective:   Physical Exam  BP (!) 148/72   Pulse 88   Temp 98.8 F (37.1 C)   Resp 18   Wt 149 lb (67.6 kg)   BMI 28.15 kg/m  Wt  Readings from Last 3 Encounters:  12/26/18 149 lb (67.6 kg)  12/11/18 148 lb 9.6 oz (67.4 kg)  08/29/18 146 lb (66.2 kg)    General: Appears her stated age, in NAD. Skin: Warm, dry and intact. No rashes noted. HEENT: Head: normal shape and size; Throat/Mouth: Teeth present, mucosa pink and moist, no exudate, lesions or ulcerations noted.  Neck:  Neck supple, trachea midline. No masses, lumps present.  Cardiovascular: Normal rate and rhythm. Murmur noted. Trace nonpitting edema BLe. Pulmonary/Chest: Normal effort and positive vesicular breath sounds. No respiratory distress. No wheezes, rales or ronchi noted.  Abdomen: Soft and nontender. Normal bowel sounds. No distention or masses noted.  Neurological: Alert and oriented.    BMET    Component Value Date/Time   NA 139 06/20/2018 0009   NA 138 07/12/2013 0344   K 4.1 06/20/2018 0009   K 4.1 07/12/2013 0344   CL 105 06/20/2018 0009   CL 105 07/12/2013 0344   CO2 28 06/20/2018 0009   CO2 28 07/12/2013 0344   GLUCOSE 119 (H) 06/20/2018 0009   GLUCOSE 113 (H) 07/12/2013 0344   BUN 27 (H) 06/20/2018 0009   BUN 24 (A) 05/05/2018   BUN 16 07/12/2013 0344   CREATININE 0.89 06/20/2018 0009   CREATININE 0.82 07/12/2013 0344   CALCIUM 9.1 06/20/2018 0009   CALCIUM 9.2 07/12/2013 0344   GFRNONAA 51 (L) 06/20/2018 0009   GFRNONAA >60 07/12/2013 0344   GFRAA 59 (L) 06/20/2018 0009   GFRAA >60 07/12/2013 0344    Lipid Panel  No results found for: CHOL, TRIG, HDL, CHOLHDL, VLDL, LDLCALC  CBC    Component Value Date/Time   WBC 6.9 06/20/2018 0009   RBC 3.76 (L) 06/20/2018 0009   HGB 11.8 (L) 06/20/2018 0009   HGB 12.0 07/12/2013 0344   HCT 34.6 (L) 06/20/2018 0009   HCT 35.3 07/12/2013 0344   PLT 250 06/20/2018 0009   PLT 250 07/12/2013 0344   MCV 91.9 06/20/2018 0009   MCV 92 07/12/2013 0344   MCH 31.4 06/20/2018 0009   MCHC 34.2 06/20/2018 0009   RDW 13.4 06/20/2018 0009   RDW 12.7 07/12/2013 0344   LYMPHSABS 2.6  06/20/2018 0009   LYMPHSABS 3.2 07/12/2013 0344   MONOABS 0.8 06/20/2018 0009   MONOABS 0.7 07/12/2013 0344   EOSABS 0.2 06/20/2018 0009   EOSABS 0.1 07/12/2013 0344   BASOSABS 0.1 06/20/2018 0009   BASOSABS 0.1 07/12/2013 0344    Hgb A1C No results found for: HGBA1C  Assessment & Plan:   CAP:  On Doxy, will extend 3 days for a 10 day course No indication for steroids, inhalers or neb treatment at this time Consuming yogurt for probiotic effects Can give Tylenol per SO if needed for fevers Can give Robitussin per SO if needed for cough  Will reassess as needed Webb Silversmith, NP

## 2019-02-10 ENCOUNTER — Telehealth: Payer: Self-pay | Admitting: Internal Medicine

## 2019-02-11 ENCOUNTER — Other Ambulatory Visit: Payer: Self-pay | Admitting: Internal Medicine

## 2019-02-11 NOTE — Telephone Encounter (Signed)
She is in assisted living at [facility]. Confirm that she gets her Rx at [place] with Luellen Pucker If so, okay to refill for a year

## 2019-02-11 NOTE — Telephone Encounter (Signed)
Left message for Luellen Pucker for verification

## 2019-02-11 NOTE — Telephone Encounter (Signed)
The Remeron comes from [facility]

## 2019-02-12 MED ORDER — MIRTAZAPINE 15 MG PO TABS: 7.5000 mg | ORAL_TABLET | Freq: Every day | ORAL | 11 refills | Status: DC

## 2019-02-12 NOTE — Addendum Note (Signed)
Addended by: Viviana Simpler I on: 02/12/2019 03:09 PM   Modules accepted: Orders

## 2019-03-11 ENCOUNTER — Other Ambulatory Visit: Payer: Self-pay

## 2019-03-11 ENCOUNTER — Ambulatory Visit: Payer: Medicare Other | Admitting: Internal Medicine

## 2019-03-11 ENCOUNTER — Encounter: Payer: Self-pay | Admitting: Internal Medicine

## 2019-03-11 DIAGNOSIS — Z8673 Personal history of transient ischemic attack (TIA), and cerebral infarction without residual deficits: Secondary | ICD-10-CM

## 2019-03-11 DIAGNOSIS — F39 Unspecified mood [affective] disorder: Secondary | ICD-10-CM

## 2019-03-11 DIAGNOSIS — I10 Essential (primary) hypertension: Secondary | ICD-10-CM

## 2019-03-11 DIAGNOSIS — K219 Gastro-esophageal reflux disease without esophagitis: Secondary | ICD-10-CM | POA: Diagnosis not present

## 2019-03-11 NOTE — Assessment & Plan Note (Signed)
Continue Remeron for mood, sleep and appetite

## 2019-03-11 NOTE — Progress Notes (Signed)
Subjective:    Patient ID: Debra Ho, female    DOB: 02/19/1915, 85 y.o.   MRN: [id number]  HPI  Saw resident in apt at [facility] ALF No new concerns from RN Resident would like to be able to get 90 day supply of all her medications Sleeps well, some RLS symptoms at night but not bothersome Independent with ADLs. Walks with rollator, no recent falls Appetite fair, weight relatively stable. Bowels okay, some urinary incontinence Denies reflux, pain or SOB  TIA: subsequent issues with speech. She is taking Hydralazine and Plavix as prescribed.   HTN: BP controlled with Hydralazine.   GERD: She denies swallowing issues or breakthrough on Pantoprazole.  Sleep Disturbance: Stable on Remeron.  Review of Systems      Past Medical History:  Diagnosis Date  . GERD without esophagitis   . Hyperlipemia   . Hypertension   . Hyponatremia 10/2017  . Mood disorder ([place])   . TIA (transient ischemic attack)     Current Outpatient Medications  Medication Sig Dispense Refill  . clopidogrel (PLAVIX) 75 MG tablet TAKE ONE TABLET EVERY DAY 90 tablet 3  . docusate sodium (COLACE) 100 MG capsule Take 100 mg by mouth 2 (two) times daily.    . hydrALAZINE (APRESOLINE) 10 MG tablet Take 10 mg by mouth 2 (two) times daily.    . mirtazapine (REMERON) 15 MG tablet Take 0.5 tablets (7.5 mg total) by mouth at bedtime. 30 tablet 11  . pantoprazole (PROTONIX) 40 MG tablet TAKE 1 TABLET BY MOUTH DAILY 90 tablet 3  . Vitamin D, Ergocalciferol, (DRISDOL) 50000 units CAPS capsule Take 50,000 Units by mouth every 30 (thirty) days.     No current facility-administered medications for this visit.     Allergies  Allergen Reactions  . Penicillins   . Sulfa Antibiotics     History reviewed. No pertinent family history.  Social History   Socioeconomic History  . Marital status: Widowed    Spouse name: Not on file  . Number of children: 5  . Years of education: Not on file  . Highest  education level: Not on file  Occupational History  . 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 Financial resource strain: Not on file  . Food insecurity:    Worry: Not on file    Inability: Not on file  . Transportation needs:    Medical: Not on file    Non-medical: Not on file  Tobacco Use  . Smoking status: Passive Smoke Exposure - Never Smoker  . Smokeless tobacco: Never Used  Substance and Sexual Activity  . Alcohol use: No    Frequency: Never  . Drug use: Not on file  . Sexual activity: Not on file  Lifestyle  . Physical activity:    Days per week: Not on file    Minutes per session: Not on file  . Stress: Not on file  Relationships  . Social connections:    Talks on phone: Not on file    Gets together: Not on file    Attends religious service: Not on file    Active member of club or organization: Not on file    Attends meetings of clubs or organizations: Not on file    Relationship status: Not on file  . Intimate partner violence:    Fear of current or ex partner: Not on file    Emotionally abused: Not on file    Physically abused:  Not on file    Forced sexual activity: Not on file  Other Topics Concern  . Not on file  Social History Narrative   Has living will.   Son [name] is health care POA   Has DNR   Would not accept tube feeds     Constitutional: Denies fever, malaise, fatigue, headache or abrupt weight changes.  Respiratory: Denies difficulty breathing, shortness of breath, cough or sputum production.   Cardiovascular: Denies chest pain, chest tightness, palpitations or swelling in the hands or feet.  Gastrointestinal: Denies abdominal pain, bloating, constipation, diarrhea or blood in the stool.  GU: Pt reports urinary incontinence. Denies urgency, frequency, pain with urination, burning sensation, blood in urine, odor or discharge. Musculoskeletal: Denies decrease in range of motion, difficulty with gait, muscle pain or joint pain and  swelling.  Neurological: Pt reports difficulty with speech. Denies dizziness, difficulty with memory, or problems with balance and coordination.  Psych: Denies anxiety, depression, SI/HI.  No other specific complaints in a complete review of systems (except as listed in HPI above).  Objective:   Physical Exam   BP (!) 142/88   Pulse 69   Temp (!) 97.5 F (36.4 C)   Wt 152 lb 3.2 oz (69 kg)   BMI 28.76 kg/m  Wt Readings from Last 3 Encounters:  03/11/19 152 lb 3.2 oz (69 kg)  12/26/18 149 lb (67.6 kg)  12/11/18 148 lb 9.6 oz (67.4 kg)    General: Appears her stated age, well developed, well nourished in NAD. Neck:  No nodes or JVD. Cardiovascular: Normal rate and rhythm. Murmur noted. 1+ non pitting edema BLE. Pulmonary/Chest: Normal effort and positive vesicular breath sounds. No respiratory distress. No wheezes, rales or ronchi noted.  Abdomen: Soft and nontender. Normal bowel sounds.  Musculoskeletal: Gait slow and steady with use of rolling walker. Neurological: Alert and oriented. Speech impediment noted. Psychiatric: Mood and affect normal.   BMET    Component Value Date/Time   NA 139 06/20/2018 0009   NA 138 07/12/2013 0344   K 4.1 06/20/2018 0009   K 4.1 07/12/2013 0344   CL 105 06/20/2018 0009   CL 105 07/12/2013 0344   CO2 28 06/20/2018 0009   CO2 28 07/12/2013 0344   GLUCOSE 119 (H) 06/20/2018 0009   GLUCOSE 113 (H) 07/12/2013 0344   BUN 27 (H) 06/20/2018 0009   BUN 24 (A) 05/05/2018   BUN 16 07/12/2013 0344   CREATININE 0.89 06/20/2018 0009   CREATININE 0.82 07/12/2013 0344   CALCIUM 9.1 06/20/2018 0009   CALCIUM 9.2 07/12/2013 0344   GFRNONAA 51 (L) 06/20/2018 0009   GFRNONAA >60 07/12/2013 0344   GFRAA 59 (L) 06/20/2018 0009   GFRAA >60 07/12/2013 0344    Lipid Panel  No results found for: CHOL, TRIG, HDL, CHOLHDL, VLDL, LDLCALC  CBC    Component Value Date/Time   WBC 6.9 06/20/2018 0009   RBC 3.76 (L) 06/20/2018 0009   HGB 11.8 (L)  06/20/2018 0009   HGB 12.0 07/12/2013 0344   HCT 34.6 (L) 06/20/2018 0009   HCT 35.3 07/12/2013 0344   PLT 250 06/20/2018 0009   PLT 250 07/12/2013 0344   MCV 91.9 06/20/2018 0009   MCV 92 07/12/2013 0344   MCH 31.4 06/20/2018 0009   MCHC 34.2 06/20/2018 0009   RDW 13.4 06/20/2018 0009   RDW 12.7 07/12/2013 0344   LYMPHSABS 2.6 06/20/2018 0009   LYMPHSABS 3.2 07/12/2013 0344   MONOABS 0.8 06/20/2018 0009  MONOABS 0.7 07/12/2013 0344   EOSABS 0.2 06/20/2018 0009   EOSABS 0.1 07/12/2013 0344   BASOSABS 0.1 06/20/2018 0009   BASOSABS 0.1 07/12/2013 0344    Hgb A1C No results found for: HGBA1C         Assessment & Plan:

## 2019-03-11 NOTE — Assessment & Plan Note (Signed)
Continue Pantoprazole for now

## 2019-03-11 NOTE — Assessment & Plan Note (Signed)
Continue Hydralazine and Plavix Appreciate ALF care

## 2019-03-11 NOTE — Assessment & Plan Note (Signed)
Continue Hydralazine Monitor

## 2019-04-30 DIAGNOSIS — I10 Essential (primary) hypertension: Secondary | ICD-10-CM | POA: Diagnosis not present

## 2019-04-30 DIAGNOSIS — G459 Transient cerebral ischemic attack, unspecified: Secondary | ICD-10-CM | POA: Diagnosis not present

## 2019-04-30 DIAGNOSIS — K219 Gastro-esophageal reflux disease without esophagitis: Secondary | ICD-10-CM | POA: Diagnosis not present

## 2019-04-30 LAB — CBC AND DIFFERENTIAL
HCT: 33 — AB (ref 36–46)
Hemoglobin: 11 — AB (ref 12.0–16.0)

## 2019-04-30 LAB — BASIC METABOLIC PANEL: Creatinine: 0.9 (ref 0.5–1.1)

## 2019-05-12 ENCOUNTER — Other Ambulatory Visit: Payer: Self-pay | Admitting: Internal Medicine

## 2019-06-04 ENCOUNTER — Other Ambulatory Visit: Payer: Self-pay

## 2019-06-04 ENCOUNTER — Encounter: Payer: Self-pay | Admitting: Internal Medicine

## 2019-06-04 ENCOUNTER — Ambulatory Visit: Payer: Medicare Other | Admitting: Internal Medicine

## 2019-06-04 DIAGNOSIS — I10 Essential (primary) hypertension: Secondary | ICD-10-CM

## 2019-06-04 DIAGNOSIS — K5900 Constipation, unspecified: Secondary | ICD-10-CM | POA: Diagnosis not present

## 2019-06-04 DIAGNOSIS — Z8673 Personal history of transient ischemic attack (TIA), and cerebral infarction without residual deficits: Secondary | ICD-10-CM

## 2019-06-04 DIAGNOSIS — K219 Gastro-esophageal reflux disease without esophagitis: Secondary | ICD-10-CM

## 2019-06-04 DIAGNOSIS — F39 Unspecified mood [affective] disorder: Secondary | ICD-10-CM | POA: Diagnosis not present

## 2019-06-04 DIAGNOSIS — N183 Chronic kidney disease, stage 3 unspecified: Secondary | ICD-10-CM | POA: Insufficient documentation

## 2019-06-04 NOTE — Assessment & Plan Note (Signed)
Has to push--then loose stools Will try changing docusate to senna s in hopes of her going more regularly

## 2019-06-04 NOTE — Assessment & Plan Note (Signed)
No new neuro symptoms On clopidogrel

## 2019-06-04 NOTE — Assessment & Plan Note (Signed)
Mild--mostly age related Will not add meds

## 2019-06-04 NOTE — Progress Notes (Addendum)
Subjective:    Patient ID: Debra Ho, female    DOB: 23-Apr-1915, 85 y.o.   MRN: [id number]  HPI Visit in her AL apartment for review of chronic health conditions Reviewed status with Luellen Pucker RN  Still frustrated by urinary incontinence Has to change pad every night---sometimes needs 2 pads Now some trouble with her bowels---not as regular as before, would wait 2-3 days. Then will have to "force it" and it is formed. The rest is liquidy Tried miralax---didn't like this Then tried prunes----has helped some Feels her lack of exercise affects her   Walks with rollator Aides twice a day to help with personal care  Continues on the bid hydralazine BP high at times----no symptoms though No chest pain No SOB No dizziness or syncope No sig edema  No regular depression Does feel "blue" at times---but never persistent Just had "wonderful birthday"--was able to see her family through a screen Not really anxious  No new neuro   No recent heartburn No dysphagia  Labs reviewed GFR 49 Hgb 11  Current Outpatient Medications on File Prior to Visit  Medication Sig Dispense Refill  . clopidogrel (PLAVIX) 75 MG tablet TAKE ONE TABLET EVERY DAY 90 tablet 3  . docusate sodium (COLACE) 100 MG capsule Take 100 mg by mouth 2 (two) times daily.    . hydrALAZINE (APRESOLINE) 10 MG tablet Take 10 mg by mouth 2 (two) times daily.    . mirtazapine (REMERON) 15 MG tablet Take 0.5 tablets (7.5 mg total) by mouth at bedtime. 30 tablet 11  . pantoprazole (PROTONIX) 40 MG tablet TAKE 1 TABLET BY MOUTH DAILY 90 tablet 3  . Vitamin D, Ergocalciferol, (DRISDOL) 50000 units CAPS capsule Take 50,000 Units by mouth every 30 (thirty) days.     No current facility-administered medications on file prior to visit.     Allergies  Allergen Reactions  . Penicillins   . Sulfa Antibiotics     Past Medical History:  Diagnosis Date  . GERD without esophagitis   . Hyperlipemia   . Hypertension   .  Hyponatremia 10/2017  . Mood disorder ([place])   . TIA (transient ischemic attack)     Past Surgical History:  Procedure Laterality Date  . ABDOMINAL HYSTERECTOMY  1950's    No family history on file.  Social History   Socioeconomic History  . Marital status: Widowed    Spouse name: Not on file  . Number of children: 5  . Years of education: Not on file  . Highest education level: Not on file  Occupational History  . 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 Financial resource strain: Not on file  . Food insecurity    Worry: Not on file    Inability: Not on file  . Transportation needs    Medical: Not on file    Non-medical: Not on file  Tobacco Use  . Smoking status: Passive Smoke Exposure - Never Smoker  . Smokeless tobacco: Never Used  Substance and Sexual Activity  . Alcohol use: No    Frequency: Never  . Drug use: Not on file  . Sexual activity: Not on file  Lifestyle  . Physical activity    Days per week: Not on file    Minutes per session: Not on file  . Stress: Not on file  Relationships  . Social Herbalist on phone: Not on file    Gets together: Not on file  Attends religious service: Not on file    Active member of club or organization: Not on file    Attends meetings of clubs or organizations: Not on file    Relationship status: Not on file  . Intimate partner violence    Fear of current or ex partner: Not on file    Emotionally abused: Not on file    Physically abused: Not on file    Forced sexual activity: Not on file  Other Topics Concern  . Not on file  Social History Narrative   Has living will.   Son [name] is health care POA   Has DNR   Would not accept tube feeds   Review of Systems Some pain in legs at times--mostly lower. No troublesome arthritic pain. Getting up helps it Has cut back on her eating Weight fairly stable Sleeps okay in general--in recliner (just disturbed by the urine incontinence)      Objective:   Physical Exam  Constitutional: No distress.  Neck: No thyromegaly present.  Cardiovascular: Normal rate and regular rhythm. Exam reveals no gallop.  Gr 3/6 systolic murmur  Respiratory: Effort normal and breath sounds normal. No respiratory distress. She has no wheezes. She has no rales.  GI: Soft. There is no abdominal tenderness.  Musculoskeletal:     Comments: Trace edema  Lymphadenopathy:    She has no cervical adenopathy.  Psychiatric: She has a normal mood and affect. Her behavior is normal.           Assessment & Plan:

## 2019-06-04 NOTE — Assessment & Plan Note (Signed)
Mild dysthymia but generally does okay Will continue the mirtazapine--may help some degree of RLS as well

## 2019-06-04 NOTE — Assessment & Plan Note (Signed)
BP Readings from Last 3 Encounters:  06/04/19 (!) 154/68  03/11/19 (!) 142/88  12/26/18 (!) 148/72   Reasonable control for age No changes

## 2019-06-04 NOTE — Assessment & Plan Note (Signed)
Quiet on the PPI Will continue

## 2019-07-27 ENCOUNTER — Other Ambulatory Visit: Payer: Self-pay | Admitting: Internal Medicine

## 2019-07-27 NOTE — Telephone Encounter (Signed)
Left a message for Debra Ho to call or email to let me know what to do about this medication

## 2019-07-27 NOTE — Telephone Encounter (Signed)
Spoke to Chicken. She said the pt gets her meds at [facility]. Her daughter picks them up and brings them to the pt. The pt self administers her medications

## 2019-07-27 NOTE — Telephone Encounter (Signed)
She is probably getting her Rx through [facility]--since she lives in assisted living now. Can confirm with Audrey---send 1 year if she still uses [facility]

## 2019-07-30 IMAGING — CT CT HEAD W/O CM
3 series · 15 of 47 positions shown, 18 images · non-contrast
Comparison: CT HEAD September 04, 2011

CLINICAL DATA: Weakness, decreased appetite for 3 days.

EXAM:
CT HEAD WITHOUT CONTRAST
TECHNIQUE: Contiguous axial images were obtained from the base of the skull
through the vertex without intravenous contrast.

[Series 3: head wo · axial · 0.43mm/px · z∈[-98,+27]mm · 9 of 30 slices shown, 12 images]
[im 3/30  brain]
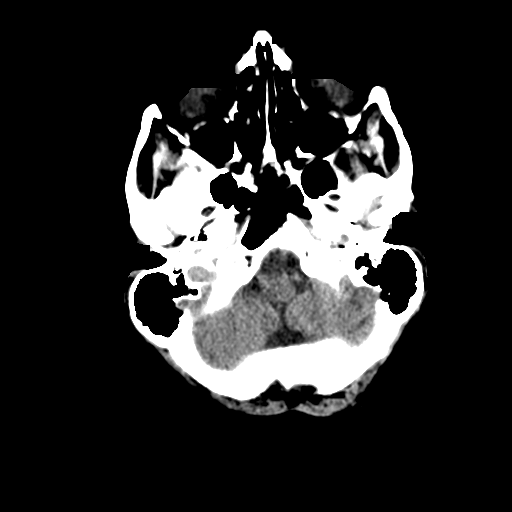
[im 3/30  bone]
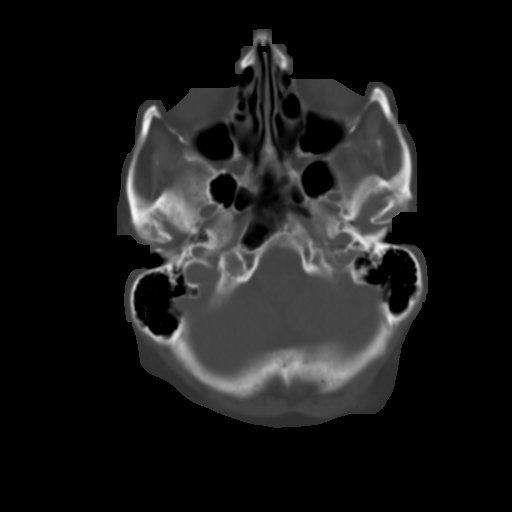
[im 6/30  brain]
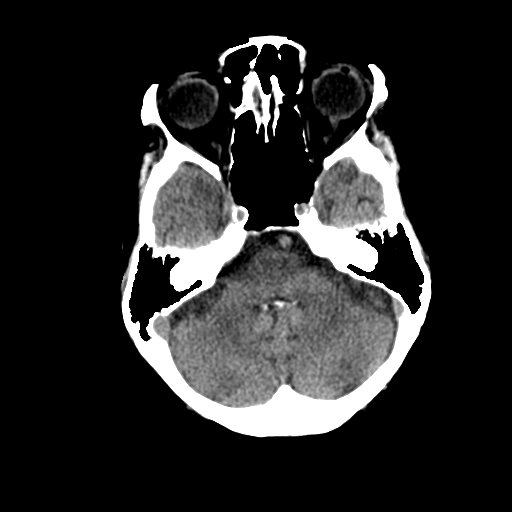
[im 9/30  brain]
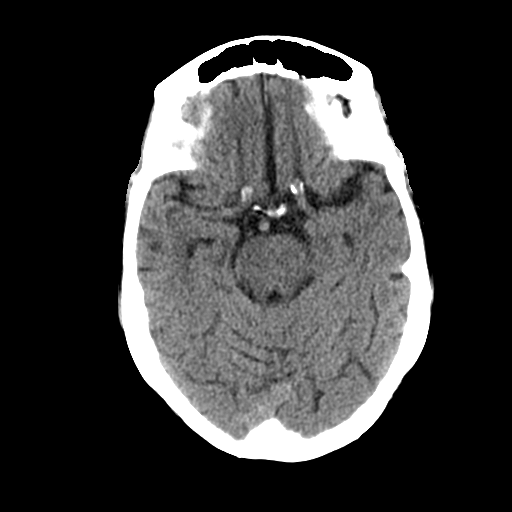
[im 12/30  brain]
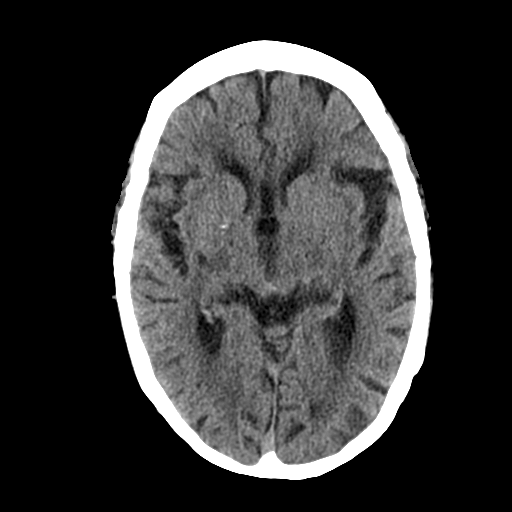
[im 16/30  brain]
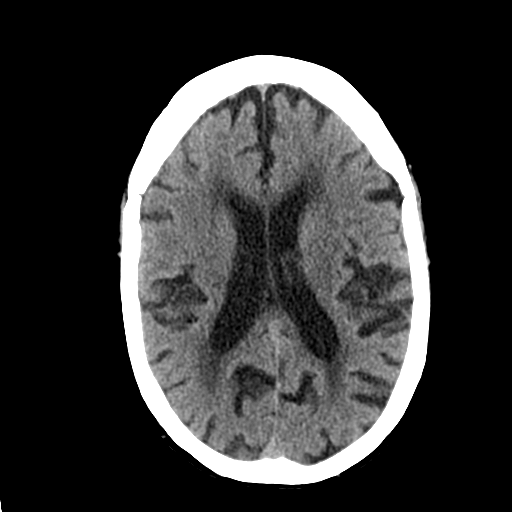
[im 16/30  bone]
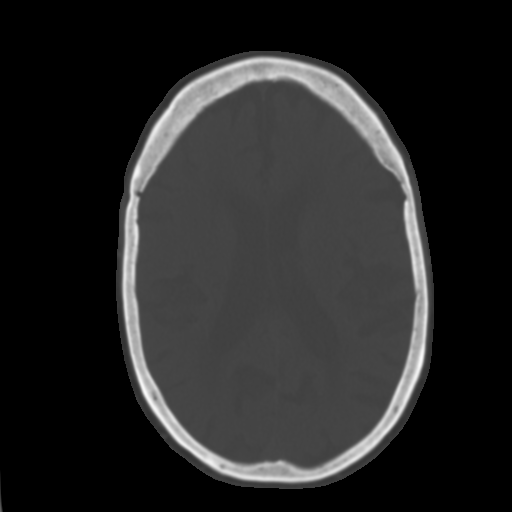
[im 19/30  brain]
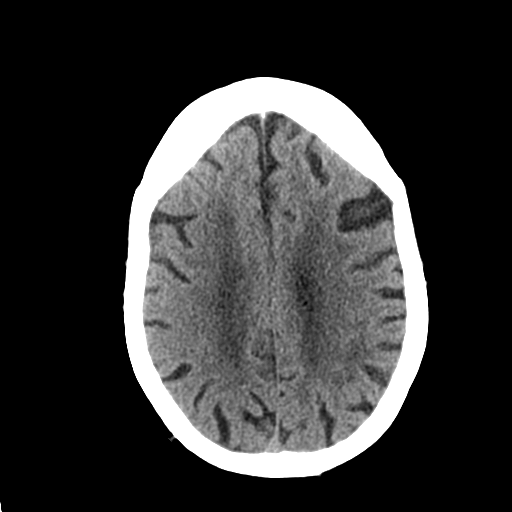
[im 22/30  brain]
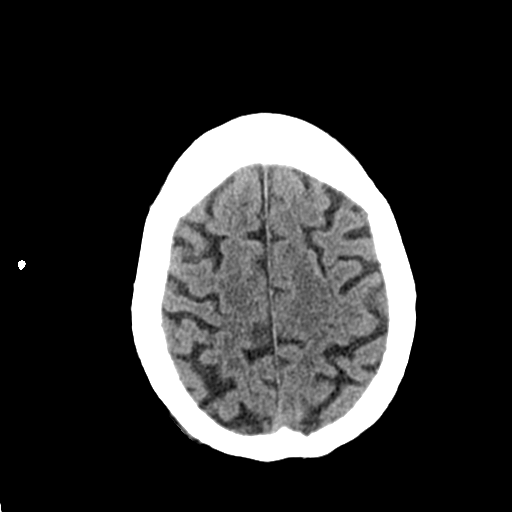
[im 25/30  brain]
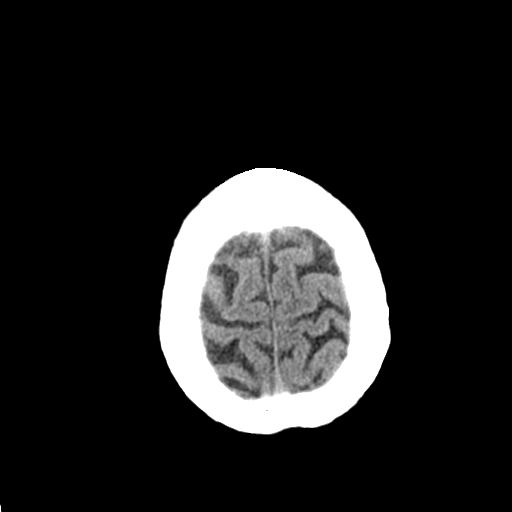
[im 28/30  brain]
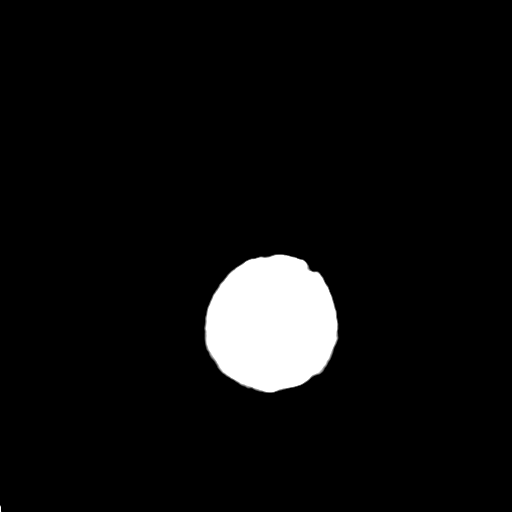
[im 28/30  bone]
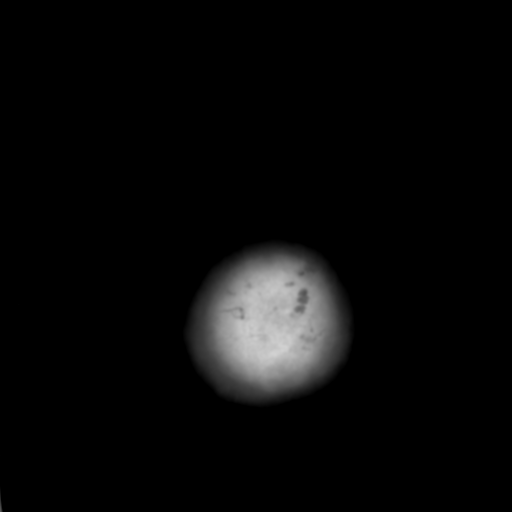

[Series 4: coronal soft tissue · coronal · 0.28mm/px · 3 of 66 slices shown]
[im 22/66  brain]
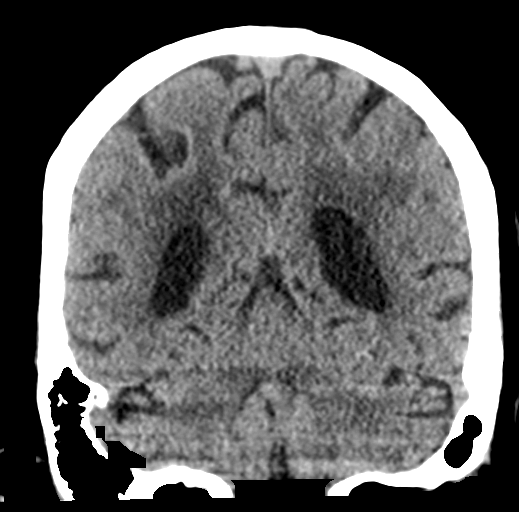
[im 29/66  brain]
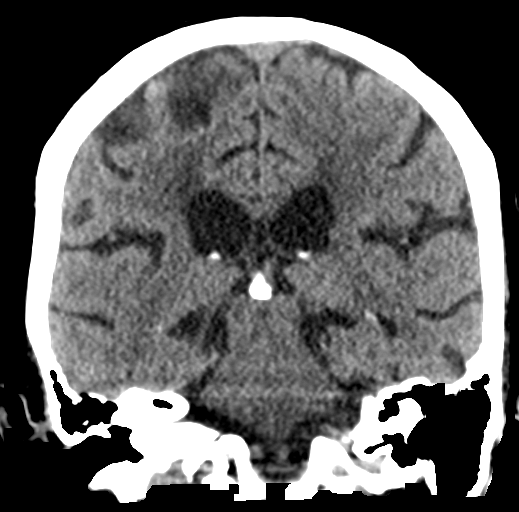
[im 37/66  brain]
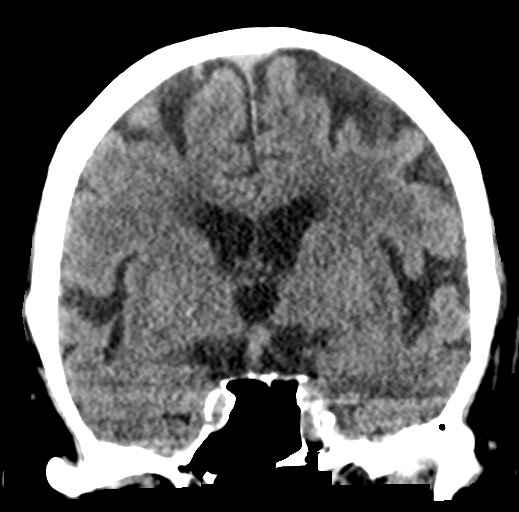

[Series 5: sagittal soft tissue · sagittal · 0.28mm/px · 3 of 49 slices shown]
[im 17/49  brain]
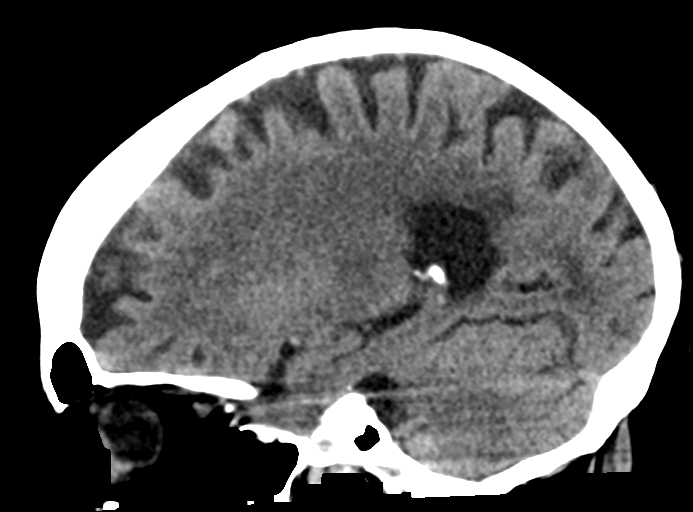
[im 25/49  brain]
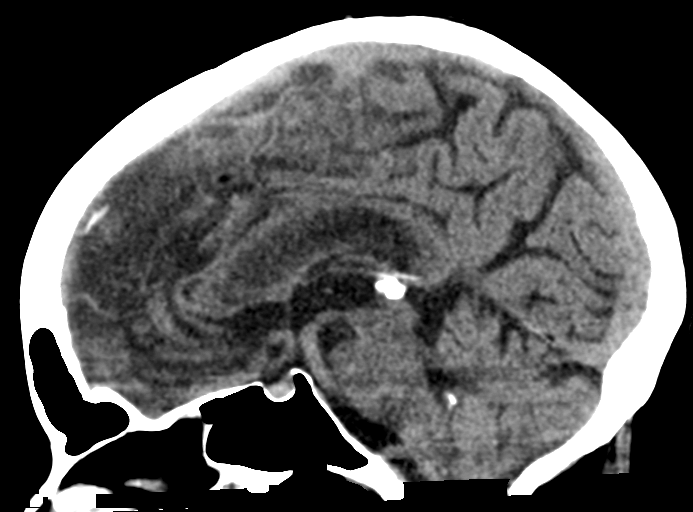
[im 33/49  brain]
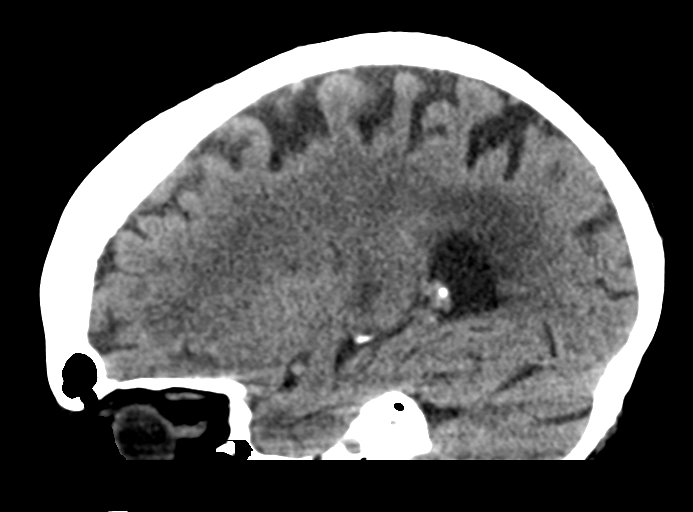

[15 of 47 positions shown; findings below may reference images not displayed]

FINDINGS: BRAIN: No intraparenchymal hemorrhage, mass effect nor midline
shift. The ventricles and sulci are normal for age. Patchy to
confluent supratentorial white matter hypodensities. Old RIGHT basal
ganglia lacunar infarct though, new from prior imaging. No acute
large vascular territory infarcts. No abnormal extra-axial fluid
collections. Basal cisterns are patent.

VASCULAR: Moderate calcific atherosclerosis of the carotid siphons.

SKULL: No skull fracture. Severe temporomandibular osteoarthrosis.
No significant scalp soft tissue swelling.

SINUSES/ORBITS: The mastoid air-cells and included paranasal sinuses
are well-aerated.The included ocular globes and orbital contents are
non-suspicious. Status post bilateral ocular lens implants.

OTHER: None.
IMPRESSION: 1. No acute intracranial process.
2. Moderate to severe chronic small vessel ischemic disease. Old
RIGHT basal ganglia lacunar infarct.

## 2019-07-30 IMAGING — CR DG CHEST 2V
2 series · 2 of 2 positions shown · non-contrast
Comparison: Chest radiograph performed 07/12/2013

CLINICAL DATA: Acute onset of generalized weakness and loss of
appetite.

EXAM:
CHEST  2 VIEW

[chest lat]
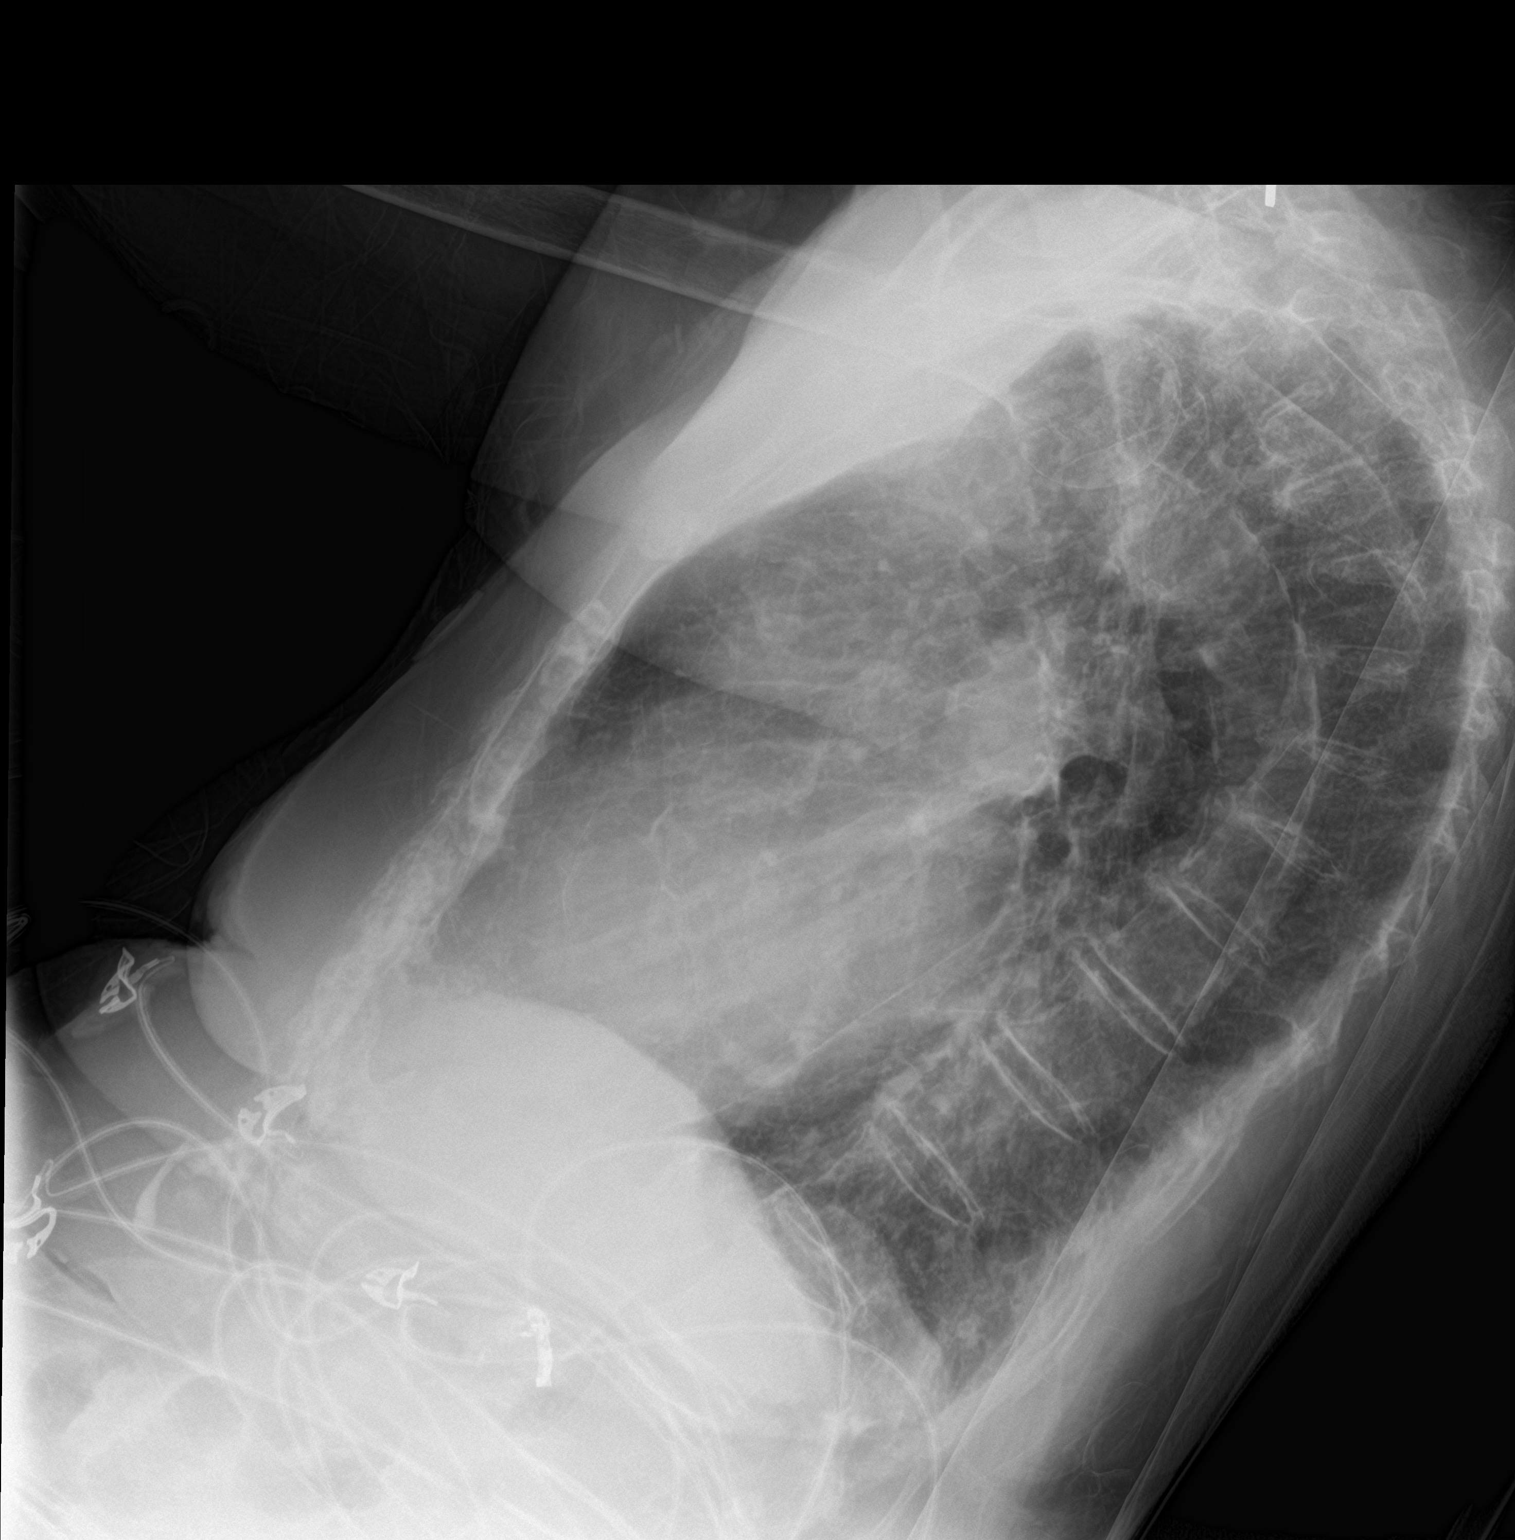

[chest ap]
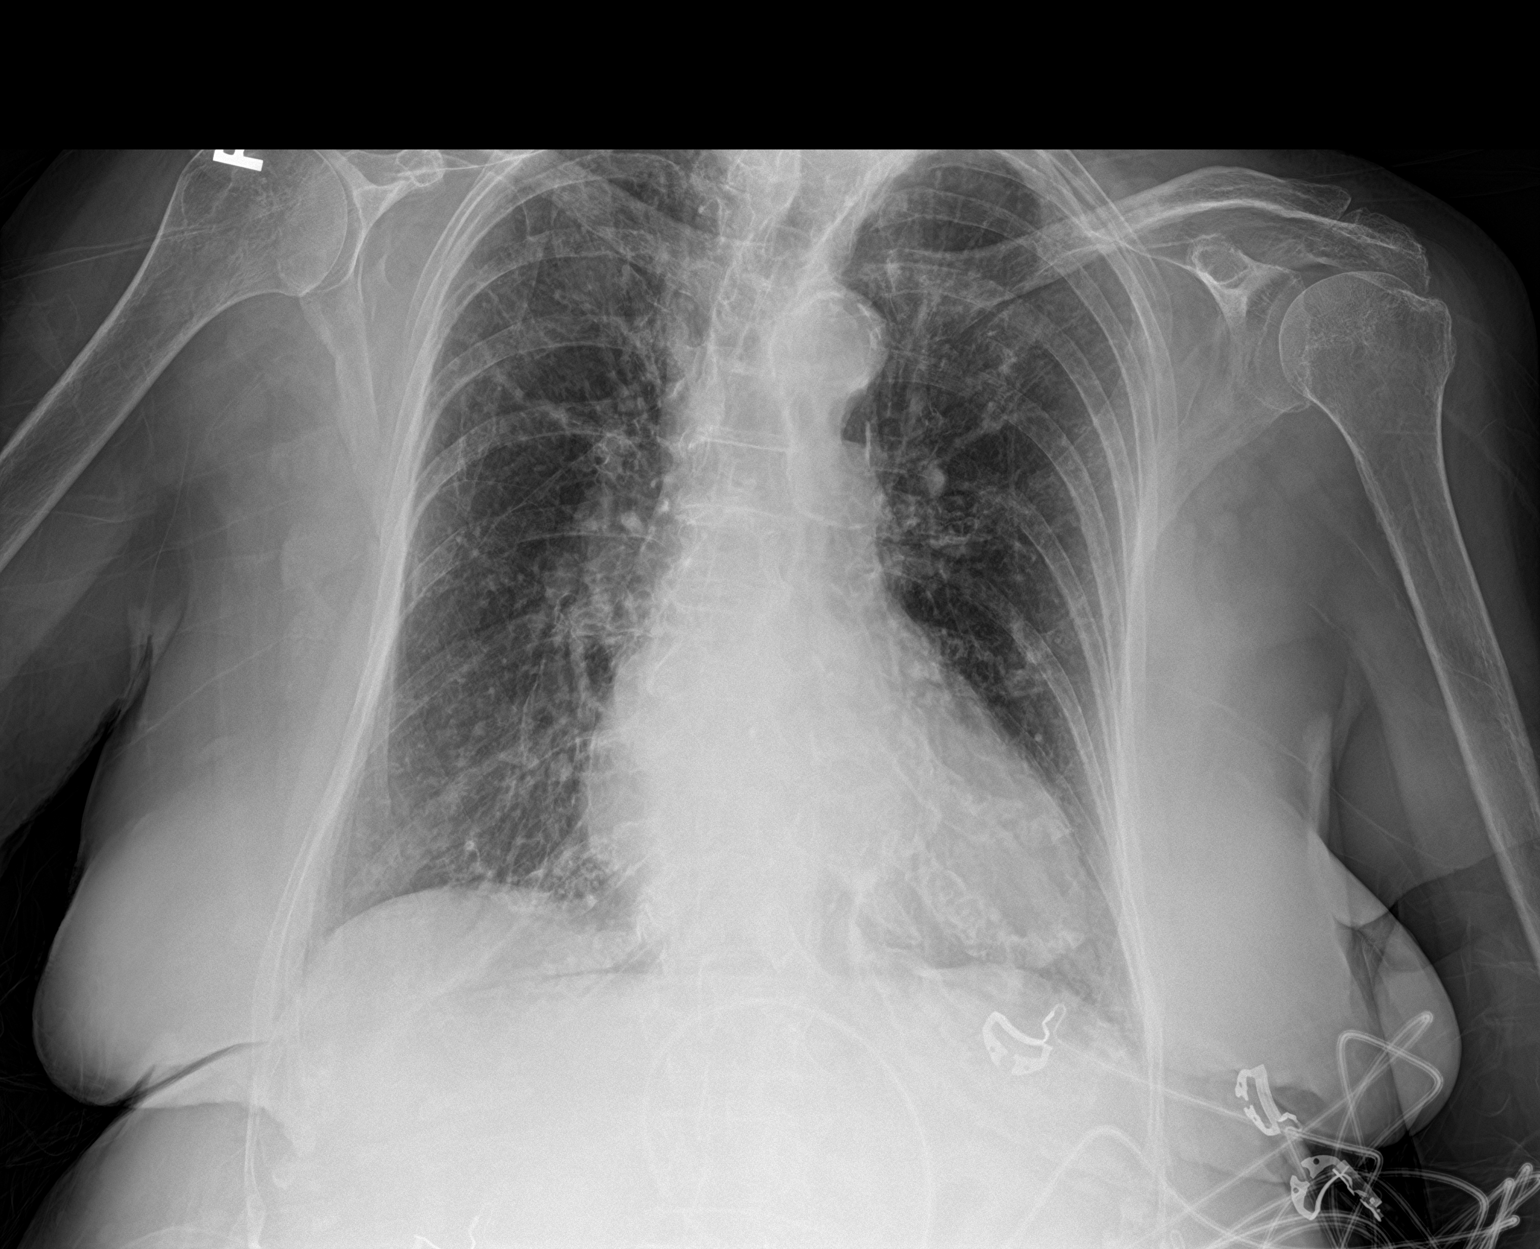

[2 of 2 positions shown; findings below may reference images not displayed]

FINDINGS: The lungs are hyperexpanded, with flattening of the hemidiaphragms,
compatible with COPD. Peribronchial thickening is noted. Minimal
bilateral atelectasis or scarring is noted. There is no evidence of
pleural effusion or pneumothorax.

The heart is mildly enlarged. No acute osseous abnormalities are
seen.
IMPRESSION: 1. Findings of COPD. Minimal bilateral atelectasis or scarring
noted.
2. Mild cardiomegaly.

## 2019-08-21 ENCOUNTER — Encounter: Payer: Self-pay | Admitting: Internal Medicine

## 2019-08-28 ENCOUNTER — Other Ambulatory Visit: Payer: Self-pay | Admitting: Internal Medicine

## 2019-09-08 DIAGNOSIS — W19XXXD Unspecified fall, subsequent encounter: Secondary | ICD-10-CM | POA: Diagnosis not present

## 2019-09-08 DIAGNOSIS — R4189 Other symptoms and signs involving cognitive functions and awareness: Secondary | ICD-10-CM | POA: Diagnosis not present

## 2019-09-08 DIAGNOSIS — Z741 Need for assistance with personal care: Secondary | ICD-10-CM | POA: Diagnosis not present

## 2019-09-08 DIAGNOSIS — F349 Persistent mood [affective] disorder, unspecified: Secondary | ICD-10-CM | POA: Diagnosis not present

## 2019-09-08 DIAGNOSIS — M6281 Muscle weakness (generalized): Secondary | ICD-10-CM | POA: Diagnosis not present

## 2019-09-08 DIAGNOSIS — R278 Other lack of coordination: Secondary | ICD-10-CM | POA: Diagnosis not present

## 2019-09-08 DIAGNOSIS — K219 Gastro-esophageal reflux disease without esophagitis: Secondary | ICD-10-CM

## 2019-09-08 DIAGNOSIS — G459 Transient cerebral ischemic attack, unspecified: Secondary | ICD-10-CM | POA: Diagnosis not present

## 2019-09-08 DIAGNOSIS — R2681 Unsteadiness on feet: Secondary | ICD-10-CM | POA: Diagnosis not present

## 2019-09-08 DIAGNOSIS — G4700 Insomnia, unspecified: Secondary | ICD-10-CM

## 2019-09-08 DIAGNOSIS — I10 Essential (primary) hypertension: Secondary | ICD-10-CM

## 2019-09-09 DIAGNOSIS — G459 Transient cerebral ischemic attack, unspecified: Secondary | ICD-10-CM | POA: Diagnosis not present

## 2019-09-09 DIAGNOSIS — R2681 Unsteadiness on feet: Secondary | ICD-10-CM | POA: Diagnosis not present

## 2019-09-09 DIAGNOSIS — M6281 Muscle weakness (generalized): Secondary | ICD-10-CM | POA: Diagnosis not present

## 2019-09-09 DIAGNOSIS — R278 Other lack of coordination: Secondary | ICD-10-CM | POA: Diagnosis not present

## 2019-09-09 DIAGNOSIS — F349 Persistent mood [affective] disorder, unspecified: Secondary | ICD-10-CM | POA: Diagnosis not present

## 2019-09-09 DIAGNOSIS — W19XXXD Unspecified fall, subsequent encounter: Secondary | ICD-10-CM | POA: Diagnosis not present

## 2019-09-10 DIAGNOSIS — W19XXXD Unspecified fall, subsequent encounter: Secondary | ICD-10-CM | POA: Diagnosis not present

## 2019-09-10 DIAGNOSIS — F349 Persistent mood [affective] disorder, unspecified: Secondary | ICD-10-CM | POA: Diagnosis not present

## 2019-09-10 DIAGNOSIS — R2681 Unsteadiness on feet: Secondary | ICD-10-CM | POA: Diagnosis not present

## 2019-09-10 DIAGNOSIS — R278 Other lack of coordination: Secondary | ICD-10-CM | POA: Diagnosis not present

## 2019-09-10 DIAGNOSIS — G459 Transient cerebral ischemic attack, unspecified: Secondary | ICD-10-CM | POA: Diagnosis not present

## 2019-09-10 DIAGNOSIS — M6281 Muscle weakness (generalized): Secondary | ICD-10-CM | POA: Diagnosis not present

## 2019-09-11 DIAGNOSIS — W19XXXD Unspecified fall, subsequent encounter: Secondary | ICD-10-CM | POA: Diagnosis not present

## 2019-09-11 DIAGNOSIS — M6281 Muscle weakness (generalized): Secondary | ICD-10-CM | POA: Diagnosis not present

## 2019-09-11 DIAGNOSIS — F349 Persistent mood [affective] disorder, unspecified: Secondary | ICD-10-CM | POA: Diagnosis not present

## 2019-09-11 DIAGNOSIS — G459 Transient cerebral ischemic attack, unspecified: Secondary | ICD-10-CM | POA: Diagnosis not present

## 2019-09-11 DIAGNOSIS — R2681 Unsteadiness on feet: Secondary | ICD-10-CM | POA: Diagnosis not present

## 2019-09-11 DIAGNOSIS — R278 Other lack of coordination: Secondary | ICD-10-CM | POA: Diagnosis not present

## 2019-09-14 DIAGNOSIS — M6281 Muscle weakness (generalized): Secondary | ICD-10-CM | POA: Diagnosis not present

## 2019-09-14 DIAGNOSIS — W19XXXD Unspecified fall, subsequent encounter: Secondary | ICD-10-CM | POA: Diagnosis not present

## 2019-09-14 DIAGNOSIS — F349 Persistent mood [affective] disorder, unspecified: Secondary | ICD-10-CM | POA: Diagnosis not present

## 2019-09-14 DIAGNOSIS — R2681 Unsteadiness on feet: Secondary | ICD-10-CM | POA: Diagnosis not present

## 2019-09-14 DIAGNOSIS — R278 Other lack of coordination: Secondary | ICD-10-CM | POA: Diagnosis not present

## 2019-09-14 DIAGNOSIS — G459 Transient cerebral ischemic attack, unspecified: Secondary | ICD-10-CM | POA: Diagnosis not present

## 2019-09-15 DIAGNOSIS — R2681 Unsteadiness on feet: Secondary | ICD-10-CM | POA: Diagnosis not present

## 2019-09-15 DIAGNOSIS — R278 Other lack of coordination: Secondary | ICD-10-CM | POA: Diagnosis not present

## 2019-09-15 DIAGNOSIS — W19XXXD Unspecified fall, subsequent encounter: Secondary | ICD-10-CM | POA: Diagnosis not present

## 2019-09-15 DIAGNOSIS — M6281 Muscle weakness (generalized): Secondary | ICD-10-CM | POA: Diagnosis not present

## 2019-09-15 DIAGNOSIS — F349 Persistent mood [affective] disorder, unspecified: Secondary | ICD-10-CM | POA: Diagnosis not present

## 2019-09-15 DIAGNOSIS — G459 Transient cerebral ischemic attack, unspecified: Secondary | ICD-10-CM | POA: Diagnosis not present

## 2019-09-16 DIAGNOSIS — R278 Other lack of coordination: Secondary | ICD-10-CM | POA: Diagnosis not present

## 2019-09-16 DIAGNOSIS — R2681 Unsteadiness on feet: Secondary | ICD-10-CM | POA: Diagnosis not present

## 2019-09-16 DIAGNOSIS — F349 Persistent mood [affective] disorder, unspecified: Secondary | ICD-10-CM | POA: Diagnosis not present

## 2019-09-16 DIAGNOSIS — M6281 Muscle weakness (generalized): Secondary | ICD-10-CM | POA: Diagnosis not present

## 2019-09-16 DIAGNOSIS — W19XXXD Unspecified fall, subsequent encounter: Secondary | ICD-10-CM | POA: Diagnosis not present

## 2019-09-16 DIAGNOSIS — G459 Transient cerebral ischemic attack, unspecified: Secondary | ICD-10-CM | POA: Diagnosis not present

## 2019-09-21 DIAGNOSIS — I639 Cerebral infarction, unspecified: Secondary | ICD-10-CM

## 2019-09-21 HISTORY — DX: Cerebral infarction, unspecified: I63.9

## 2019-10-15 ENCOUNTER — Encounter: Payer: Self-pay | Admitting: Internal Medicine

## 2019-10-15 ENCOUNTER — Ambulatory Visit: Payer: Medicare Other | Admitting: Internal Medicine

## 2019-10-15 ENCOUNTER — Other Ambulatory Visit: Payer: Self-pay

## 2019-10-15 VITALS — BP 142/56 | HR 85 | Temp 98.0°F | Resp 18 | Wt 138.0 lb

## 2019-10-15 DIAGNOSIS — K219 Gastro-esophageal reflux disease without esophagitis: Secondary | ICD-10-CM | POA: Diagnosis not present

## 2019-10-15 DIAGNOSIS — F39 Unspecified mood [affective] disorder: Secondary | ICD-10-CM | POA: Diagnosis not present

## 2019-10-15 DIAGNOSIS — Z8673 Personal history of transient ischemic attack (TIA), and cerebral infarction without residual deficits: Secondary | ICD-10-CM | POA: Diagnosis not present

## 2019-10-15 DIAGNOSIS — I10 Essential (primary) hypertension: Secondary | ICD-10-CM

## 2019-10-15 DIAGNOSIS — K5900 Constipation, unspecified: Secondary | ICD-10-CM | POA: Diagnosis not present

## 2019-10-15 NOTE — Assessment & Plan Note (Signed)
BP Readings from Last 3 Encounters:  10/15/19 (!) 142/56  06/04/19 (!) 154/68  03/11/19 (!) 142/88   Good control for age No changes needed

## 2019-10-15 NOTE — Assessment & Plan Note (Addendum)
Has resolved and no new neuro symptoms Functional status is stable Remains on plavix

## 2019-10-15 NOTE — Assessment & Plan Note (Signed)
Mostly anxiety and sleep problems Focused on her fear of going back to health care Reminded her that the new, beautiful building will be opening soon Will continue the mirtazapine

## 2019-10-15 NOTE — Assessment & Plan Note (Signed)
Quiet on the PPI 

## 2019-10-15 NOTE — Progress Notes (Signed)
Subjective:    Patient ID: Debra Ho, female    DOB: August 01, 1915, 85 y.o.   MRN: [id number]  HPI Visit in [place] apartment to follow up after recent stay in health care after TIA Reviewed status with Luellen Pucker RN  She had TIA---with mostly homonymous hemianopsia and mild aphasia Recovered quickly but persistent weakness Did make it back here and now stable again  [facility] with rollator Gets aide help with ADLs twice a day Does go to the bathroom herself at times---but needs help Wears a diaper for some incontinence Does go down to the dining room for her meals  No vision changes No weakness, facial droop, etc  Has been very worried about the prospect of going back to health care Expressed considerable anger when over there---this is better though  Current Outpatient Medications on File Prior to Visit  Medication Sig Dispense Refill  . clopidogrel (PLAVIX) 75 MG tablet TAKE ONE TABLET EVERY DAY 90 tablet 3  . hydrALAZINE (APRESOLINE) 10 MG tablet TAKE ONE TABLET 3 TIMES DAILY 90 tablet 11  . mirtazapine (REMERON) 15 MG tablet TAKE 1/2 TABLET AT BEDTIME 30 tablet 11  . pantoprazole (PROTONIX) 40 MG tablet TAKE 1 TABLET BY MOUTH DAILY 90 tablet 3  . sennosides-docusate sodium (SENOKOT-S) 8.6-50 MG tablet Take 2 tablets by mouth daily.    . Vitamin D, Ergocalciferol, (DRISDOL) 50000 units CAPS capsule Take 50,000 Units by mouth every 30 (thirty) days.     No current facility-administered medications on file prior to visit.     Allergies  Allergen Reactions  . Penicillins   . Sulfa Antibiotics     Past Medical History:  Diagnosis Date  . GERD without esophagitis   . Hyperlipemia   . Hypertension   . Hyponatremia 10/2017  . Mood disorder ([place])   . TIA (transient ischemic attack)     Past Surgical History:  Procedure Laterality Date  . ABDOMINAL HYSTERECTOMY  1950's    No family history on file.  Social History   Socioeconomic History  . Marital status: Widowed     Spouse name: Not on file  . Number of children: 5  . Years of education: Not on file  . Highest education level: Not on file  Occupational History  . 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 Financial resource strain: Not on file  . Food insecurity    Worry: Not on file    Inability: Not on file  . Transportation needs    Medical: Not on file    Non-medical: Not on file  Tobacco Use  . Smoking status: Passive Smoke Exposure - Never Smoker  . Smokeless tobacco: Never Used  Substance and Sexual Activity  . Alcohol use: No    Frequency: Never  . Drug use: Not on file  . Sexual activity: Not on file  Lifestyle  . Physical activity    Days per week: Not on file    Minutes per session: Not on file  . Stress: Not on file  Relationships  . Social Herbalist on phone: Not on file    Gets together: Not on file    Attends religious service: Not on file    Active member of club or organization: Not on file    Attends meetings of clubs or organizations: Not on file    Relationship status: Not on file  . Intimate partner violence    Fear of current or ex  partner: Not on file    Emotionally abused: Not on file    Physically abused: Not on file    Forced sexual activity: Not on file  Other Topics Concern  . Not on file  Social History Narrative   Has living will.   Son [name] is health care POA   Has DNR   Would not accept tube feeds   Review of Systems Appetite fairly good  Weight realsonably stable Some constipation if she doesn't eat well----- senna helps Doesn't sleep well--- due to her anxiety    Objective:   Physical Exam  Constitutional: She appears well-developed. No distress.  Neck: No thyromegaly present.  Cardiovascular: Normal rate and regular rhythm. Exam reveals no gallop.  Gr 3/6 coarse aortic systolic murmur  Respiratory: Effort normal and breath sounds normal. No respiratory distress. She has no wheezes. She has no rales.  GI:  Soft. There is no abdominal tenderness.  Musculoskeletal:        General: No tenderness or edema.  Lymphadenopathy:    She has no cervical adenopathy.  Neurological:  Normal engagement Some trouble with the clickers----picked up phone to try to turn TV off  Psychiatric:  Clearly some low level anxiety           Assessment & Plan:

## 2019-10-15 NOTE — Assessment & Plan Note (Signed)
Okay with the senna

## 2019-10-19 ENCOUNTER — Inpatient Hospital Stay
Admission: EM | Admit: 2019-10-19 | Discharge: 2019-10-22 | DRG: 072 | Disposition: A | Discharge status: Skilled Nursing Facility | Payer: Medicare Other | Source: Skilled Nursing Facility | Attending: Internal Medicine | Admitting: Internal Medicine

## 2019-10-19 ENCOUNTER — Encounter: Payer: Self-pay | Admitting: Emergency Medicine

## 2019-10-19 ENCOUNTER — Observation Stay: Payer: Medicare Other

## 2019-10-19 ENCOUNTER — Other Ambulatory Visit: Payer: Self-pay

## 2019-10-19 ENCOUNTER — Emergency Department: Payer: Medicare Other

## 2019-10-19 DIAGNOSIS — G459 Transient cerebral ischemic attack, unspecified: Secondary | ICD-10-CM | POA: Diagnosis present

## 2019-10-19 DIAGNOSIS — R55 Syncope and collapse: Secondary | ICD-10-CM | POA: Diagnosis not present

## 2019-10-19 DIAGNOSIS — K5900 Constipation, unspecified: Secondary | ICD-10-CM | POA: Diagnosis present

## 2019-10-19 DIAGNOSIS — N182 Chronic kidney disease, stage 2 (mild): Secondary | ICD-10-CM | POA: Diagnosis present

## 2019-10-19 DIAGNOSIS — G9389 Other specified disorders of brain: Secondary | ICD-10-CM | POA: Diagnosis present

## 2019-10-19 DIAGNOSIS — J9811 Atelectasis: Secondary | ICD-10-CM | POA: Diagnosis not present

## 2019-10-19 DIAGNOSIS — F39 Unspecified mood [affective] disorder: Secondary | ICD-10-CM | POA: Diagnosis present

## 2019-10-19 DIAGNOSIS — R531 Weakness: Secondary | ICD-10-CM | POA: Diagnosis not present

## 2019-10-19 DIAGNOSIS — Z7902 Long term (current) use of antithrombotics/antiplatelets: Secondary | ICD-10-CM | POA: Diagnosis not present

## 2019-10-19 DIAGNOSIS — Z7722 Contact with and (suspected) exposure to environmental tobacco smoke (acute) (chronic): Secondary | ICD-10-CM | POA: Diagnosis present

## 2019-10-19 DIAGNOSIS — Z882 Allergy status to sulfonamides status: Secondary | ICD-10-CM

## 2019-10-19 DIAGNOSIS — R911 Solitary pulmonary nodule: Secondary | ICD-10-CM | POA: Diagnosis not present

## 2019-10-19 DIAGNOSIS — H9190 Unspecified hearing loss, unspecified ear: Secondary | ICD-10-CM | POA: Diagnosis present

## 2019-10-19 DIAGNOSIS — I4891 Unspecified atrial fibrillation: Secondary | ICD-10-CM | POA: Diagnosis not present

## 2019-10-19 DIAGNOSIS — E860 Dehydration: Secondary | ICD-10-CM | POA: Diagnosis not present

## 2019-10-19 DIAGNOSIS — I129 Hypertensive chronic kidney disease with stage 1 through stage 4 chronic kidney disease, or unspecified chronic kidney disease: Secondary | ICD-10-CM | POA: Diagnosis present

## 2019-10-19 DIAGNOSIS — Z888 Allergy status to other drugs, medicaments and biological substances status: Secondary | ICD-10-CM

## 2019-10-19 DIAGNOSIS — Z7401 Bed confinement status: Secondary | ICD-10-CM | POA: Diagnosis not present

## 2019-10-19 DIAGNOSIS — N183 Chronic kidney disease, stage 3 unspecified: Secondary | ICD-10-CM | POA: Diagnosis present

## 2019-10-19 DIAGNOSIS — Z8673 Personal history of transient ischemic attack (TIA), and cerebral infarction without residual deficits: Secondary | ICD-10-CM | POA: Diagnosis not present

## 2019-10-19 DIAGNOSIS — Z90710 Acquired absence of both cervix and uterus: Secondary | ICD-10-CM | POA: Diagnosis not present

## 2019-10-19 DIAGNOSIS — Z79899 Other long term (current) drug therapy: Secondary | ICD-10-CM | POA: Diagnosis not present

## 2019-10-19 DIAGNOSIS — I350 Nonrheumatic aortic (valve) stenosis: Secondary | ICD-10-CM

## 2019-10-19 DIAGNOSIS — K2100 Gastro-esophageal reflux disease with esophagitis, without bleeding: Secondary | ICD-10-CM | POA: Diagnosis present

## 2019-10-19 DIAGNOSIS — J189 Pneumonia, unspecified organism: Secondary | ICD-10-CM | POA: Diagnosis not present

## 2019-10-19 DIAGNOSIS — M2550 Pain in unspecified joint: Secondary | ICD-10-CM | POA: Diagnosis not present

## 2019-10-19 DIAGNOSIS — R4182 Altered mental status, unspecified: Secondary | ICD-10-CM | POA: Diagnosis not present

## 2019-10-19 DIAGNOSIS — R410 Disorientation, unspecified: Secondary | ICD-10-CM | POA: Diagnosis not present

## 2019-10-19 DIAGNOSIS — R4189 Other symptoms and signs involving cognitive functions and awareness: Secondary | ICD-10-CM | POA: Diagnosis not present

## 2019-10-19 DIAGNOSIS — Z20828 Contact with and (suspected) exposure to other viral communicable diseases: Secondary | ICD-10-CM | POA: Diagnosis present

## 2019-10-19 DIAGNOSIS — N1831 Chronic kidney disease, stage 3a: Secondary | ICD-10-CM | POA: Diagnosis not present

## 2019-10-19 DIAGNOSIS — R918 Other nonspecific abnormal finding of lung field: Secondary | ICD-10-CM | POA: Diagnosis present

## 2019-10-19 DIAGNOSIS — Z880 Allergy status to penicillin: Secondary | ICD-10-CM

## 2019-10-19 DIAGNOSIS — R0902 Hypoxemia: Secondary | ICD-10-CM | POA: Diagnosis not present

## 2019-10-19 DIAGNOSIS — J984 Other disorders of lung: Secondary | ICD-10-CM | POA: Diagnosis not present

## 2019-10-19 DIAGNOSIS — Z66 Do not resuscitate: Secondary | ICD-10-CM | POA: Diagnosis present

## 2019-10-19 DIAGNOSIS — R404 Transient alteration of awareness: Secondary | ICD-10-CM | POA: Diagnosis present

## 2019-10-19 DIAGNOSIS — I10 Essential (primary) hypertension: Secondary | ICD-10-CM | POA: Diagnosis not present

## 2019-10-19 LAB — DIFFERENTIAL
Abs Immature Granulocytes: 0.03 10*3/uL (ref 0.00–0.07)
Basophils Absolute: 0.1 10*3/uL (ref 0.0–0.1)
Basophils Relative: 1 %
Eosinophils Absolute: 0.1 10*3/uL (ref 0.0–0.5)
Eosinophils Relative: 2 %
Immature Granulocytes: 0 %
Lymphocytes Relative: 23 %
Lymphs Abs: 1.7 10*3/uL (ref 0.7–4.0)
Monocytes Absolute: 0.6 10*3/uL (ref 0.1–1.0)
Monocytes Relative: 8 %
Neutro Abs: 4.9 10*3/uL (ref 1.7–7.7)
Neutrophils Relative %: 66 %

## 2019-10-19 LAB — URINALYSIS, COMPLETE (UACMP) WITH MICROSCOPIC
Bilirubin Urine: NEGATIVE
Glucose, UA: NEGATIVE mg/dL
Hgb urine dipstick: NEGATIVE
Ketones, ur: NEGATIVE mg/dL
Leukocytes,Ua: NEGATIVE
Nitrite: POSITIVE — AB
Protein, ur: NEGATIVE mg/dL
Specific Gravity, Urine: 1.01 (ref 1.005–1.030)
pH: 6 (ref 5.0–8.0)

## 2019-10-19 LAB — COMPREHENSIVE METABOLIC PANEL
ALT: 13 U/L (ref 0–44)
AST: 17 U/L (ref 15–41)
Albumin: 3.4 g/dL — ABNORMAL LOW (ref 3.5–5.0)
Alkaline Phosphatase: 69 U/L (ref 38–126)
Anion gap: 12 (ref 5–15)
BUN: 12 mg/dL (ref 8–23)
CO2: 27 mmol/L (ref 22–32)
Calcium: 9.2 mg/dL (ref 8.9–10.3)
Chloride: 101 mmol/L (ref 98–111)
Creatinine, Ser: 0.78 mg/dL (ref 0.44–1.00)
GFR calc Af Amer: >60 mL/min (ref >60)
GFR calc non Af Amer: >60 mL/min (ref >60)
Glucose, Bld: 104 mg/dL — ABNORMAL HIGH (ref 70–99)
Potassium: 3.2 mmol/L — ABNORMAL LOW (ref 3.5–5.1)
Sodium: 140 mmol/L (ref 135–145)
Total Bilirubin: 0.7 mg/dL (ref 0.3–1.2)
Total Protein: 6.9 g/dL (ref 6.5–8.1)

## 2019-10-19 LAB — CBC
HCT: 36.5 % (ref 36.0–46.0)
Hemoglobin: 12 g/dL (ref 12.0–15.0)
MCH: 30.8 pg (ref 26.0–34.0)
MCHC: 32.9 g/dL (ref 30.0–36.0)
MCV: 93.6 fL (ref 80.0–100.0)
Platelets: 241 10*3/uL (ref 150–400)
RBC: 3.9 MIL/uL (ref 3.87–5.11)
RDW: 13.1 % (ref 11.5–15.5)
WBC: 7.4 10*3/uL (ref 4.0–10.5)
nRBC: 0 % (ref 0.0–0.2)

## 2019-10-19 LAB — URINE DRUG SCREEN, QUALITATIVE (ARMC ONLY)
Amphetamines, Ur Screen: NOT DETECTED
Barbiturates, Ur Screen: NOT DETECTED
Benzodiazepine, Ur Scrn: NOT DETECTED
Cannabinoid 50 Ng, Ur ~~LOC~~: NOT DETECTED
Cocaine Metabolite,Ur ~~LOC~~: NOT DETECTED
MDMA (Ecstasy)Ur Screen: NOT DETECTED
Methadone Scn, Ur: NOT DETECTED
Opiate, Ur Screen: NOT DETECTED
Phencyclidine (PCP) Ur S: NOT DETECTED
Tricyclic, Ur Screen: NOT DETECTED

## 2019-10-19 LAB — BRAIN NATRIURETIC PEPTIDE: B Natriuretic Peptide: 182 pg/mL — ABNORMAL HIGH (ref 0.0–100.0)

## 2019-10-19 LAB — PROTIME-INR
INR: 1 (ref 0.8–1.2)
Prothrombin Time: 13.2 seconds (ref 11.4–15.2)

## 2019-10-19 LAB — APTT: aPTT: 26 seconds (ref 24–36)

## 2019-10-19 LAB — ETHANOL: Alcohol, Ethyl (B): <10 mg/dL (ref <10)

## 2019-10-19 LAB — PROCALCITONIN: Procalcitonin: <0.1 ng/mL

## 2019-10-19 MED ORDER — IOHEXOL 300 MG/ML  SOLN
50.0000 mL | Freq: Once | INTRAMUSCULAR | Status: AC | PRN
  Administered 2019-10-19: 14:00:00 via INTRAVENOUS

## 2019-10-19 MED ORDER — SODIUM CHLORIDE 0.9 % IV SOLN
500.0000 mg | INTRAVENOUS | Status: DC
  Administered 2019-10-19 – 2019-10-21 (×3): 500 mg via INTRAVENOUS
  Filled 2019-10-19 (×3): qty 500

## 2019-10-19 MED ORDER — CLOPIDOGREL BISULFATE 75 MG PO TABS
75.0000 mg | ORAL_TABLET | Freq: Every day | ORAL | Status: DC
  Administered 2019-10-20 – 2019-10-22 (×3): 75 mg via ORAL
  Filled 2019-10-19 (×3): qty 1

## 2019-10-19 MED ORDER — MIRTAZAPINE 15 MG PO TABS
7.5000 mg | ORAL_TABLET | Freq: Every day | ORAL | Status: DC
  Administered 2019-10-19 – 2019-10-21 (×3): 7.5 mg via ORAL
  Filled 2019-10-19 (×3): qty 1

## 2019-10-19 MED ORDER — POTASSIUM CHLORIDE CRYS ER 20 MEQ PO TBCR
40.0000 meq | EXTENDED_RELEASE_TABLET | Freq: Once | ORAL | Status: AC
  Administered 2019-10-19: 13:00:00 40 meq via ORAL
  Filled 2019-10-19: qty 2

## 2019-10-19 MED ORDER — HYDRALAZINE HCL 10 MG PO TABS
10.0000 mg | ORAL_TABLET | Freq: Once | ORAL | Status: AC
  Administered 2019-10-19: 13:00:00 10 mg via ORAL
  Filled 2019-10-19: qty 1

## 2019-10-19 MED ORDER — PANTOPRAZOLE SODIUM 40 MG PO TBEC
40.0000 mg | DELAYED_RELEASE_TABLET | Freq: Every day | ORAL | Status: DC
  Administered 2019-10-20 – 2019-10-22 (×3): 40 mg via ORAL
  Filled 2019-10-19 (×3): qty 1

## 2019-10-19 MED ORDER — FUROSEMIDE 10 MG/ML IJ SOLN
20.0000 mg | Freq: Once | INTRAMUSCULAR | Status: AC
  Administered 2019-10-19: 14:00:00 20 mg via INTRAVENOUS
  Filled 2019-10-19: qty 4

## 2019-10-19 MED ORDER — SODIUM CHLORIDE 0.9 % IV SOLN
2.0000 g | INTRAVENOUS | Status: DC
  Administered 2019-10-19 – 2019-10-21 (×3): 2 g via INTRAVENOUS
  Filled 2019-10-19 (×2): qty 2
  Filled 2019-10-19: qty 20

## 2019-10-19 MED ORDER — HYDRALAZINE HCL 10 MG PO TABS
10.0000 mg | ORAL_TABLET | Freq: Three times a day (TID) | ORAL | Status: DC
  Administered 2019-10-19 – 2019-10-22 (×8): 10 mg via ORAL
  Filled 2019-10-19 (×11): qty 1

## 2019-10-19 NOTE — ED Provider Notes (Signed)
[facility] Emergency Department Provider Note  ____________________________________________  Time seen: Approximately 10:58 AM  I have reviewed the triage vital signs and the nursing notes.   HISTORY  Chief Complaint Altered Mental Status    HPI Debra Ho is a 85 y.o. female with a history of hypertension hyponatremia TIA GERD who is brought to the ED due to  altered mental status, confusion and responsive only to painful stimulus.  She lives at [facility] assisted living, normally is ambulatory and conversant.  Blood glucose was normal.  During transport, she suddenly came back to normal mental status and was interactive with EMS.  No history of trauma.  Patient reports recollection of the episode this morning but cannot explain what happened.  She denies any pain currently and states that she feels back to normal.     Past Medical History:  Diagnosis Date  . GERD without esophagitis   . Hyperlipemia   . Hypertension   . Hyponatremia 10/2017  . Mood disorder ([place])   . TIA (transient ischemic attack)      Patient Active Problem List   Diagnosis Date Noted  . Constipation 06/04/2019  . Chronic renal disease, stage III 06/04/2019  . History of TIA (transient ischemic attack) 01/16/2018  . Essential hypertension, benign 11/20/2017  . Mood disorder ([place])   . GERD without esophagitis      Past Surgical History:  Procedure Laterality Date  . ABDOMINAL HYSTERECTOMY  1950's     Prior to Admission medications   Medication Sig Start Date End Date Taking? Authorizing Provider  clopidogrel (PLAVIX) 75 MG tablet TAKE ONE TABLET EVERY DAY 10/22/18   Viviana Simpler I, MD  hydrALAZINE (APRESOLINE) 10 MG tablet TAKE ONE TABLET 3 TIMES DAILY 07/27/19   Viviana Simpler I, MD  mirtazapine (REMERON) 15 MG tablet TAKE 1/2 TABLET AT BEDTIME 08/31/19   Viviana Simpler I, MD  pantoprazole (PROTONIX) 40 MG tablet TAKE 1 TABLET BY MOUTH DAILY 05/12/19    Venia Carbon, MD  sennosides-docusate sodium (SENOKOT-S) 8.6-50 MG tablet Take 2 tablets by mouth daily.    [provider]  Vitamin D, Ergocalciferol, (DRISDOL) 50000 units CAPS capsule Take 50,000 Units by mouth every 30 (thirty) days.    [provider]     Allergies Penicillins and Sulfa antibiotics   No family history on file.  Social History Social History   Tobacco Use  . Smoking status: Passive Smoke Exposure - Never Smoker  . Smokeless tobacco: Never Used  Substance Use Topics  . Alcohol use: No    Frequency: Never  . Drug use: Not on file    Review of Systems  Constitutional:   No fever or chills.  ENT:   No sore throat. No rhinorrhea. Cardiovascular:   No chest pain or syncope. Respiratory:   No dyspnea or cough. Gastrointestinal:   Negative for abdominal pain, vomiting and diarrhea.  Musculoskeletal:   Negative for focal pain or swelling All other systems reviewed and are negative except as documented above in ROS and HPI.  ____________________________________________   PHYSICAL EXAM:  VITAL SIGNS: ED Triage Vitals  Enc Vitals Group     BP 10/19/19 0923 (!) 180/83     Pulse Rate 10/19/19 0923 73     Resp 10/19/19 0923 (!) 24     Temp 10/19/19 0923 97.9 F (36.6 C)     Temp Source 10/19/19 0923 Oral     SpO2 10/19/19 0923 96 %  Weight 10/19/19 0924 138 lb 0.1 oz (62.6 kg)     Height 10/19/19 0924 5' (1.524 m)     Head Circumference --      Peak Flow --      Pain Score 10/19/19 0924 0     Pain Loc --      Pain Edu? --      Excl. in [place]? --     Vital signs reviewed, nursing assessments reviewed.   Constitutional:   Alert and oriented. Non-toxic appearance. Eyes:   Conjunctivae are normal. EOMI. PERRL. ENT      Head:   Normocephalic and atraumatic.      Nose: Normal.      Mouth/Throat: Dry mucous membranes.      Neck:   No meningismus. Full ROM. Hematological/Lymphatic/Immunilogical:   No cervical  lymphadenopathy. Cardiovascular:   RRR. Symmetric bilateral radial and DP pulses.  No murmurs. Cap refill less than 2 seconds. Respiratory:   Mild tachypnea. Breath sounds are clear and equal bilaterally. No wheezes/rales/rhonchi. Gastrointestinal:   Soft and nontender. Non distended. There is no CVA tenderness.  No rebound, rigidity, or guarding.  Musculoskeletal:   Normal range of motion in all extremities. No joint effusions.  No lower extremity tenderness.  No edema. Neurologic:   Normal speech and language.  Motor grossly intact.  No extremity drift. No acute focal neurologic deficits are appreciated.  NIH stroke scale 0 GCS 15 Skin:    Skin is warm, dry and intact. No rash noted.  No petechiae, purpura, or bullae.  ____________________________________________    LABS (pertinent positives/negatives) (all labs ordered are listed, but only abnormal results are displayed) Labs Reviewed  COMPREHENSIVE METABOLIC PANEL - Abnormal; Notable for the following components:      Result Value   Potassium 3.2 (*)    Glucose, Bld 104 (*)    Albumin 3.4 (*)    All other components within normal limits  SARS CORONAVIRUS 2 (TAT 6-24 HRS)  CBC  DIFFERENTIAL  URINE DRUG SCREEN, QUALITATIVE ([facility] ONLY)  URINALYSIS, COMPLETE (UACMP) WITH MICROSCOPIC  PROCALCITONIN  ETHANOL  PROTIME-INR  APTT   ____________________________________________   EKG  Interpreted by me Atrial fibrillation, rate of 75, left axis, normal intervals.  Normal QRS ST segments and T waves.  ____________________________________________    RADIOLOGY  Ct Head Wo Contrast  Result Date: 10/19/2019 CLINICAL DATA:  Altered mental status EXAM: CT HEAD WITHOUT CONTRAST TECHNIQUE: Contiguous axial images were obtained from the base of the skull through the vertex without intravenous contrast. COMPARISON:  06/20/2018 FINDINGS: Brain: No evidence of acute infarction, hemorrhage, hydrocephalus, extra-axial collection or mass  lesion/mass effect. Old right basal ganglia infarct. Subcortical white matter and periventricular small vessel ischemic changes. Mild age related atrophy. Vascular: Intracranial atherosclerosis. Skull: Normal. Negative for fracture or focal lesion. Sinuses/Orbits: The visualized paranasal sinuses are essentially clear. The mastoid air cells are unopacified. Other: None. IMPRESSION: No evidence of acute intracranial abnormality. Atrophy with small vessel ischemic changes and old right thalamic infarct. Electronically Signed   By: Julian Hy M.D.   On: 10/19/2019 09:58   Dg Chest Portable 1 View  Result Date: 10/19/2019 CLINICAL DATA:  Altered mental status and confusion. EXAM: PORTABLE CHEST 1 VIEW COMPARISON:  06/20/2018 FINDINGS: Cardiac enlargement is stable. Aortic atherosclerosis. There is diffuse pulmonary vascular congestion identified bilaterally. Bilateral patchy nodular densities are scattered throughout both lungs, nonspecific. This is new when compared with the previous exam. Visualized osseous structures are unremarkable. IMPRESSION: 1. Cardiac enlargement  and pulmonary vascular congestion. 2. Bilateral patchy nodular densities are scattered throughout both lungs, new from the previous exam and may represent areas of multifocal infection or metastatic disease. Electronically Signed   By: Kerby Moors M.D.   On: 10/19/2019 10:32    ____________________________________________   PROCEDURES Procedures  ____________________________________________  DIFFERENTIAL DIAGNOSIS   Intracranial hemorrhage, ischemic stroke, non-STEMI, delirium, medication side effect, pneumonia, UTI, dehydration, electrolyte abnormality  CLINICAL IMPRESSION / ASSESSMENT AND PLAN / ED COURSE  Medications ordered in the ED: Medications  cefTRIAXone (ROCEPHIN) 2 g in sodium chloride 0.9 % 100 mL IVPB (2 g Intravenous New Bag/Given 10/19/19 1057)  azithromycin (ZITHROMAX) 500 mg in sodium chloride 0.9 % 250  mL IVPB (has no administration in time range)    Pertinent labs & imaging results that were available during my care of the patient were reviewed by me and considered in my medical decision making (see chart for details).  Debra Ho was evaluated in Emergency Department on 10/19/2019 for the symptoms described in the history of present illness. She was evaluated in the context of the global COVID-19 pandemic, which necessitated consideration that the patient might be at risk for infection with the SARS-CoV-2 virus that causes COVID-19. Institutional protocols and algorithms that pertain to the evaluation of patients at risk for COVID-19 are in a state of rapid change based on information released by regulatory bodies including the CDC and federal and state organizations. These policies and algorithms were followed during the patient's care in the ED.   Patient presents with an episode of unresponsiveness described as being comatose.  Return to normal just prior to arrival in the ED.  Currently denies any symptoms.  With her age and comorbidities, will need a CT scan of the head, chest x-ray, labs.  ----------------------------------------- 10:25 AM on 10/19/2019 ----------------------------------------- CT head negative.   Clinical Course as of Oct 18 1057  Mon Oct 19, 2019  1037 Chest x-ray shows multifocal infiltrates suggestive of infection versus metastatic disease.  With her episode of altered mental status, she will need to be hospitalized for further work-up of possible pneumonia with delirium versus TIA/stroke.  Currently hemodynamically stable, awake and alert.   [PS]    Clinical Course User Index [PS] Carrie Mew, MD    ----------------------------------------- 11:40 AM on 10/19/2019 -----------------------------------------  Case discussed with hospitalist for further evaluation.  Lab panel unremarkable.  Ceftriaxone and azithromycin  ordered.   ____________________________________________   FINAL CLINICAL IMPRESSION(S) / ED DIAGNOSES    Final diagnoses:  Multifocal pneumonia  TIA (transient ischemic attack)     ED Discharge Orders    None      Portions of this note were generated with dragon dictation software. Dictation errors may occur despite best attempts at proofreading.   Carrie Mew, MD 10/19/19 1141

## 2019-10-19 NOTE — ED Triage Notes (Signed)
Arrives from [facility].  Per [facility], patient was altered this morning, only responding to painful stimuli.    CBG:  122.  VS wnl.  Last seen at baseline at 2330 last night -- Per [facility] report.

## 2019-10-19 NOTE — ED Notes (Signed)
Pt resting quietly.  Pt waiting on admission bed.

## 2019-10-19 NOTE — ED Notes (Signed)
Resumed care from [facility].  Pt alert and talking  afib on monitor.  Iv in place.  External urinary cath in place.  No acute distress.

## 2019-10-19 NOTE — H&P (Addendum)
History and Physical  Debra Ho [id number] DOB: [birth year] DOA: 10/19/2019  Referring physician: Vania Rea, ER physician PCP: Venia Carbon, MD  Outpatient Specialists: None Patient coming from: [place] is able to ambulate using a walker  Chief Complaint: Unresponsive  HPI: Debra Ho is a 85 y.o. female with medical history significant for being very hard of hearing, hypertension and previous TIAs on Plavix who this morning was found by her skilled nursing facility and unresponsive, reported even to a deep sternal rub.  The rest the patient's vital signs were all reportedly normal.  On the way to the emergency room, patient suddenly woke up.  She had some recollection of the episode, but really could not describe what happened or what caused it.  Patient denied any pain, shortness of breath or other complaints.  The emergency room, she had a full work-up with normal labs, normal vitals other than elevated blood pressure in the 170s-180s because she did not get her blood pressure medicine this morning.  Chest x-ray noted bilateral patchy nodular densities scattered throughout both lungs new from previous x-ray on July 2019 and represented possible areas of multifocal infection versus metastatic disease.  Patient has no previous history of cancer.  Procalcitonin level was normal.  Hospitalist were called for further evaluation.   Review of Systems: Patient seen in the emergency room. Pt is extremely hard of hearing, but with yelling, was able to get her to hear me in her right ear.  She says she has no complaints.   Pt denies any chest pain or shortness of breath or abdominal pain.  She denies any dizziness or headaches..  Review of systems are otherwise negative   Past Medical History:  Diagnosis Date   GERD without esophagitis    Hyperlipemia    Hypertension    Hyponatremia 10/2017   Mood disorder ([place])    Stroke ([place]) 09/21/2019    left sided weakness   TIA (transient ischemic attack)    Past Surgical History:  Procedure Laterality Date   ABDOMINAL HYSTERECTOMY  1950's    Social History:  reports that she is a non-smoker but has been exposed to tobacco smoke. She has never used smokeless tobacco. She reports that she does not drink alcohol.  Lives in a nursing facility, ambulates with a walker   Allergies  Allergen Reactions   Esomeprazole Magnesium Other (See Comments)    ABD Pain   Lansoprazole Diarrhea   Lisinopril Cough   Penicillins    Raloxifene Other (See Comments)    Edema   Sulfa Antibiotics     Family history: No family history on file.  I repeatedly tried to ask the patient what medical problems run in her family, but she could not understand what I was saying  Prior to Admission medications   Medication Sig Start Date End Date Taking? Authorizing Provider  clopidogrel (PLAVIX) 75 MG tablet TAKE ONE TABLET EVERY DAY Patient taking differently: Take 75 mg by mouth daily.  10/22/18  Yes Viviana Simpler I, MD  hydrALAZINE (APRESOLINE) 10 MG tablet TAKE ONE TABLET 3 TIMES DAILY Patient taking differently: Take 10 mg by mouth 3 (three) times daily.  07/27/19  Yes Venia Carbon, MD  mirtazapine (REMERON) 15 MG tablet TAKE 1/2 TABLET AT BEDTIME Patient taking differently: Take 7.5 mg by mouth at bedtime.  08/31/19  Yes Venia Carbon, MD  [facility] powder Apply 1 application topically 2 (two) times daily. 09/14/19  Yes [provider]  pantoprazole (PROTONIX) 40 MG tablet TAKE 1 TABLET BY MOUTH DAILY Patient taking differently: Take 40 mg by mouth daily.  05/12/19  Yes Venia Carbon, MD  sennosides-docusate sodium (SENOKOT-S) 8.6-50 MG tablet Take 2 tablets by mouth daily.   Yes [provider]  Vitamin D, Ergocalciferol, (DRISDOL) 50000 units CAPS capsule Take 50,000 Units by mouth every 30 (thirty) days.   Yes [provider]    Physical Exam: BP (!) 151/91     Pulse (!) 115    Temp 97.9 F (36.6 C) (Oral)    Resp (!) 25    Ht 5' (1.524 m)    Wt 62.6 kg    SpO2 98%    BMI 26.95 kg/m   General: Awake, alert and oriented x2, no acute distress Eyes: Sclera nonicteric, extraocular movements appear intact ENT: Normocephalic and atraumatic, mucous membranes slightly dry Neck: Supple, no JVD Cardiovascular: 3 out of 6 holosystolic murmur, occasional ectopic beat Respiratory: Clear to auscultation bilaterally Abdomen: Soft, nontender, nondistended, hypoactive bowel sounds Skin: No skin breaks, tears or lesions Musculoskeletal: No clubbing or cyanosis or edema Psychiatric: Appropriate, no evidence of psychoses Neurologic: No focal deficits          Labs on Admission:  Basic Metabolic Panel: Recent Labs  Lab 10/19/19 1017  NA 140  K 3.2*  CL 101  CO2 27  GLUCOSE 104*  BUN 12  CREATININE 0.78  CALCIUM 9.2   Liver Function Tests: Recent Labs  Lab 10/19/19 1017  AST 17  ALT 13  ALKPHOS 69  BILITOT 0.7  PROT 6.9  ALBUMIN 3.4*   No results for input(s): LIPASE, AMYLASE in the last 168 hours. No results for input(s): AMMONIA in the last 168 hours. CBC: Recent Labs  Lab 10/19/19 0936  WBC 7.4  NEUTROABS 4.9  HGB 12.0  HCT 36.5  MCV 93.6  PLT 241   Cardiac Enzymes: No results for input(s): CKTOTAL, CKMB, CKMBINDEX, TROPONINI in the last 168 hours.  BNP (last 3 results) Recent Labs    10/19/19 0936  BNP 182.0*    ProBNP (last 3 results) No results for input(s): PROBNP in the last 8760 hours.  CBG: No results for input(s): GLUCAP in the last 168 hours.  Radiological Exams on Admission: Ct Head Wo Contrast  Result Date: 10/19/2019 CLINICAL DATA:  Altered mental status EXAM: CT HEAD WITHOUT CONTRAST TECHNIQUE: Contiguous axial images were obtained from the base of the skull through the vertex without intravenous contrast. COMPARISON:  06/20/2018 FINDINGS: Brain: No evidence of acute infarction, hemorrhage, hydrocephalus,  extra-axial collection or mass lesion/mass effect. Old right basal ganglia infarct. Subcortical white matter and periventricular small vessel ischemic changes. Mild age related atrophy. Vascular: Intracranial atherosclerosis. Skull: Normal. Negative for fracture or focal lesion. Sinuses/Orbits: The visualized paranasal sinuses are essentially clear. The mastoid air cells are unopacified. Other: None. IMPRESSION: No evidence of acute intracranial abnormality. Atrophy with small vessel ischemic changes and old right thalamic infarct. Electronically Signed   By: Julian Hy M.D.   On: 10/19/2019 09:58   Ct Chest W Contrast  Result Date: 10/19/2019 CLINICAL DATA:  Syncope.  Abnormal findings on chest x-ray. EXAM: CT CHEST WITH CONTRAST TECHNIQUE: Multidetector CT imaging of the chest was performed during intravenous contrast administration. CONTRAST:  <See Chart> OMNIPAQUE IOHEXOL 300 MG/ML  SOLN COMPARISON:  10/15/2017 FINDINGS: Mild motion artifact is present. Cardiovascular: Mild cardiomegaly. Calcification of the mitral valve annulus. Calcified plaque over the left main and 3  vessel coronary arteries. Calcified plaque over the thoracic aorta. Thoracic aorta is otherwise normal in caliber. Remaining vascular structures are unremarkable. Mediastinum/Nodes: 1.1 cm precarinal lymph node. Otherwise, no evidence of mediastinal or hilar adenopathy. Remaining mediastinal structures are unremarkable. Lungs/Pleura: Lungs are adequately inflated. Subtle bibasilar atelectasis. No effusion. Punctate calcified granuloma over the right middle lobe. Stable 4 mm nodule right middle lobe. 5-6 mm nodule (previously 4-5 mm) over the left upper lobe. Mild central vascular crowding. Airways are unremarkable. Upper Abdomen: Calcified plaque over the upper abdomen. Subcentimeter hypodensity over the left lobe of the liver without significant change likely a cyst or hemangioma. Partially visualized exophytic cyst over the upper  pole left kidney. Musculoskeletal: Degenerative change of the spine IMPRESSION: 1. No acute cardiopulmonary disease. Minimal bibasilar atelectasis. 2. Stable 4 mm nodule over the right middle lobe. 5-6 mm nodule over the left upper lobe slightly larger (previously 4-5 mm). 1.1 cm precarinal lymph node likely reactive. Recommend follow-up CT 1 year. This recommendation follows the consensus statement: Guidelines for Management of Small Pulmonary Nodules Detected on CT Scans: A Statement from the [employer] as published in Radiology 2005; 237:395-400. Online at: https://www.arnold.com/. 3. Aortic Atherosclerosis (ICD10-I70.0). Cardiomegaly and atherosclerotic coronary artery disease. 4. Subcentimeter hypodensity over the left lobe of the liver unchanged and too small to characterize but likely a cyst or hemangioma. Small cyst over the upper pole left kidney. Electronically Signed   By: Marin Olp M.D.   On: 10/19/2019 14:46   Dg Chest Portable 1 View  Result Date: 10/19/2019 CLINICAL DATA:  Altered mental status and confusion. EXAM: PORTABLE CHEST 1 VIEW COMPARISON:  06/20/2018 FINDINGS: Cardiac enlargement is stable. Aortic atherosclerosis. There is diffuse pulmonary vascular congestion identified bilaterally. Bilateral patchy nodular densities are scattered throughout both lungs, nonspecific. This is new when compared with the previous exam. Visualized osseous structures are unremarkable. IMPRESSION: 1. Cardiac enlargement and pulmonary vascular congestion. 2. Bilateral patchy nodular densities are scattered throughout both lungs, new from the previous exam and may represent areas of multifocal infection or metastatic disease. Electronically Signed   By: Kerby Moors M.D.   On: 10/19/2019 10:32    EKG: Not done.   Assessment/Plan Present on Admission:  Unresponsive episode: Unclear etiology.  No evidence of acute stroke.  No evidence of respiratory suppression  or hypercarbia.  No evidence of arrhythmia.  Patient appears to be back at her baseline fully.  Given abnormal chest x-ray findings, description of the event itself and elevated blood pressure, will observe overnight.  Her Covid test is negative.  Very hard of hearing: Continue to monitor   Abnormal chest x-ray with multiple nodules: Oxygen saturations at room air.  Procalcitonin level normal.  This may be an incidental finding and given her advanced age, would not try to work-up.  I do not think that this is related to her unresponsive episode.  Otherwise, will check a chest CT at least for further identification.   Mood disorder ([place])   Essential hypertension, benign: Due to unresponsiveness, did not take her morning medications, have restarted those.   Chronic renal disease, stage I-II: Appears to be at baseline  Moderate aortic stenosis: I would suspect that if this was a factor, patient may have had a syncopal event that was unwitnessed and then woke up from this.  Again, given recent echocardiogram approximately 13 months ago and the fact that we would not do any intervention, will just make note of this.  No reason to check an  echo again  Active Problems:   Mood disorder ([place])   Essential hypertension, benign   History of TIA (transient ischemic attack)   Chronic renal disease, stage III   Unresponsive episode   Abnormal chest x-ray with multiple nodules   DVT prophylaxis: Lovenox  Code Status: DNR  Family Communication: Left message for son  Disposition Plan: Anticipate returning back to skilled nursing tomorrow  Consults called: None  Admission status: Given expectation for discharge tomorrow, placed under observation    Annita Brod MD [facility] Pager [phone]  If 7PM-7AM, please contact night-coverage [email or web address] Password [facility]  10/19/2019, 3:04 PM

## 2019-10-19 NOTE — ED Notes (Signed)
Pt call light going off, this tech and EDT [place] responses and pt states that she is "wet but she is deaf and can't hear what we are saying" this tech checked pt brief and pt remains dry, pure wick in place

## 2019-10-19 NOTE — ED Notes (Signed)
20 ga saline lock to right hand placed by EMS PTA.  IV infiltrated with flush.  IV D/C'd

## 2019-10-19 NOTE — ED Notes (Signed)
Pt sleeping. 

## 2019-10-19 NOTE — ED Notes (Signed)
Report called to erin rn floor nurse  °

## 2019-10-19 NOTE — ED Notes (Signed)
Patient transported to CT 

## 2019-10-19 NOTE — ED Triage Notes (Signed)
Patient arrives to ED AAOx3.  MAE equally and strong.

## 2019-10-20 DIAGNOSIS — Z8673 Personal history of transient ischemic attack (TIA), and cerebral infarction without residual deficits: Secondary | ICD-10-CM

## 2019-10-20 DIAGNOSIS — R918 Other nonspecific abnormal finding of lung field: Secondary | ICD-10-CM

## 2019-10-20 LAB — SARS CORONAVIRUS 2 (TAT 6-24 HRS): SARS Coronavirus 2: NEGATIVE

## 2019-10-20 MED ORDER — SODIUM CHLORIDE 0.9 % IV SOLN
INTRAVENOUS | Status: DC | PRN
  Administered 2019-10-20: 09:00:00 250 mL via INTRAVENOUS

## 2019-10-20 MED ORDER — ENOXAPARIN SODIUM 30 MG/0.3ML ~~LOC~~ SOLN
30.0000 mg | SUBCUTANEOUS | Status: DC
  Administered 2019-10-20 – 2019-10-22 (×3): 30 mg via SUBCUTANEOUS
  Filled 2019-10-20 (×4): qty 0.3

## 2019-10-20 MED ORDER — SODIUM CHLORIDE 0.9 % IV BOLUS
500.0000 mL | Freq: Once | INTRAVENOUS | Status: AC
  Administered 2019-10-20: 500 mL via INTRAVENOUS

## 2019-10-20 NOTE — Progress Notes (Signed)
VAST consulted for IV access. Pt's nurse able to obtain IV access before VAST able to respond to consult.

## 2019-10-20 NOTE — Evaluation (Signed)
Physical Therapy Evaluation Patient Details Name: Debra Ho MRN: [id number] DOB: 05-Apr-1915 Today's Date: 10/20/2019   History of Present Illness  pt is a 85yo female admitted after being found unresponsive by staff at ALF, pt woke up in ambulance during transport to hospital. PMH includes HTN, TIAs, stroke with residual left sided weakness, very HOH  Clinical Impression  Pt is a 85 yo female admitted for above. Pt in bed upon arrival with daughter in law present. Pt agreed to participate with PT. Pt currently living at [facility] and per DIL has been ambulating with a rollator, needing assistance for bathing and dressing and receiving OT. Per DIL the pt needs more assistance than ALF. Pt presents with decreased strength (L > R secondary to residual weakness from past stroke), balance and endurance. Pt is currently extremely fearful during mobility especially anything OOB with pt stating "im scared to death" multiple times during session. pts DIL states she has been asking for much more assistance from staff at ALF over the last 5 weeks. Pt performed LE therex supine and seated with min cuing for correct performance. Pt required min A for bed mobility and transfers and refused further OOB with ambulation. Pt agreed to ambulate a few forward/backward steps with RW in room requiring min A for RW mgt and encouragament. Pt would benefit from skilled acute PT to improve strength, balance and confidence with mobility. Recommendation is for STR in order to improve deficits. Pt currently needs 24 hour assistance especially for OOB mobility. If ALF can provide that assistance pt could return home with home health. Pts daughter in law educated on SNF recommendation and agreed.     Follow Up Recommendations SNF;Supervision/Assistance - 24 hour    Equipment Recommendations  Rolling walker with 5" wheels    Recommendations for Other Services       Precautions / Restrictions  Precautions Precautions: Fall Restrictions Weight Bearing Restrictions: No      Mobility  Bed Mobility Overal bed mobility: Needs Assistance Bed Mobility: Supine to Sit     Supine to sit: HOB elevated;Min assist     General bed mobility comments: min A to get EOB for LE advancement and trunk elevation, pt repeatedly reporting she was terrified  Transfers Overall transfer level: Needs assistance Equipment used: Rolling walker (2 wheeled) Transfers: Sit to/from Stand Sit to Stand: Min assist         General transfer comment: light min A to rise from bed, pt required cuing for hand placement and use of RW, pt steady with initial standing although reporting very scared of falling, pt refused ambulation but able to take a few steps forward and backward with min A  Ambulation/Gait                Stairs            Wheelchair Mobility    Modified Rankin (Stroke Patients Only)       Balance Overall balance assessment: Needs assistance Sitting-balance support: Single extremity supported;Feet supported Sitting balance-Leahy Scale: Fair Sitting balance - Comments: pt steady sitting EOB performing LE therex   Standing balance support: Bilateral upper extremity supported;During functional activity Standing balance-Leahy Scale: Poor Standing balance comment: pt reliant on UE support from RW                             Pertinent Vitals/Pain Pain Assessment: No/denies pain    Home Living Family/patient  expects to be discharged to:: Assisted living               Home Equipment: Walker - 4 wheels      Prior Function Level of Independence: Independent with assistive device(s)         Comments: per chart review and pts daughter in law pt has been walking with rollator around ALF, pt has been needing assistance with getting to/from bath, getting dressed     Hand Dominance        Extremity/Trunk Assessment   Upper Extremity  Assessment Upper Extremity Assessment: Generalized weakness(left side weaker than right)    Lower Extremity Assessment Lower Extremity Assessment: Generalized weakness(left side weaker)    Cervical / Trunk Assessment Cervical / Trunk Assessment: Kyphotic  Communication   Communication: HOH  Cognition Arousal/Alertness: Awake/alert Behavior During Therapy: WFL for tasks assessed/performed Overall Cognitive Status: Within Functional Limits for tasks assessed                                        General Comments      Exercises Total Joint Exercises Ankle Circles/Pumps: AROM;Both;10 reps Towel Squeeze: AROM;Both;10 reps Heel Slides: AROM;Both;10 reps Hip ABduction/ADduction: AROM;Both;10 reps Straight Leg Raises: AROM;Both;10 reps Long Arc Quad: AROM;Both;10 reps Marching in Standing: AROM;Both;10 reps;Seated;Standing General Exercises - Lower Extremity Heel Raises: AROM;Both;10 reps;Seated   Assessment/Plan    PT Assessment Patient needs continued PT services  PT Problem List Decreased strength;Decreased mobility;Decreased range of motion;Decreased activity tolerance;Decreased balance;Decreased knowledge of use of DME;Decreased safety awareness       PT Treatment Interventions DME instruction;Therapeutic exercise;Gait training;Balance training;Stair training;Neuromuscular re-education;Functional mobility training;Therapeutic activities;Patient/family education    PT Goals (Current goals can be found in the Care Plan section)  Acute Rehab PT Goals Patient Stated Goal: go home PT Goal Formulation: With patient Time For Goal Achievement: 11/03/19 Potential to Achieve Goals: Good    Frequency Min 2X/week   Barriers to discharge        Co-evaluation               AM-PAC PT "6 Clicks" Mobility  Outcome Measure Help needed turning from your back to your side while in a flat bed without using bedrails?: A Little Help needed moving from lying on  your back to sitting on the side of a flat bed without using bedrails?: A Little Help needed moving to and from a bed to a chair (including a wheelchair)?: A Little Help needed standing up from a chair using your arms (e.g., wheelchair or bedside chair)?: A Little Help needed to walk in hospital room?: A Little Help needed climbing 3-5 steps with a railing? : A Lot 6 Click Score: 17    End of Session Equipment Utilized During Treatment: Gait belt Activity Tolerance: Patient tolerated treatment well Patient left: in bed;with call bell/phone within reach;with family/visitor present Nurse Communication: Mobility status PT Visit Diagnosis: Muscle weakness (generalized) (M62.81);Difficulty in walking, not elsewhere classified (R26.2);Unsteadiness on feet (R26.81)    Time: [id number] PT Time Calculation (min) (ACUTE ONLY): 34 min   Charges:   PT Evaluation $PT Eval Moderate Complexity: 1 Mod PT Treatments $Therapeutic Exercise: 8-22 mins       Kaytie Ratcliffe PT, DPT 4:28 PM,10/20/19 [phone]

## 2019-10-20 NOTE — Discharge Summary (Signed)
Physician Discharge Summary  MILLARD COLBAUGH [id number] DOB: Jan 24, 1915 DOA: 10/19/2019  PCP: Venia Carbon, MD  Admit date: 10/19/2019 Discharge date: 10/20/2019  Admitted From: Assisted living  disposition: Assisted living  Recommendations for Outpatient Follow-up:  1. Follow up with PCP in 1-2 weeks 2. Please obtain BMP/CBC in one week your next doctors visit.    Discharge Condition: Stable CODE STATUS: DNR Diet recommendation:  2 g salt  Brief/Interim Summary: 85 year old with history of essential hypertension, TIAs, difficulty hearing was briefly found unresponsive at her assisted living facility therefore brought to the hospital.  Spontaneously woke up several minutes later and did not have most of the recollection.  Of the episode.  She felt back to her baseline.  She was admitted to the hospital for further care and work-up.  Chest x-ray showed bilateral patchy nodular density.  Procalcitonin was normal.  Initially she was given Lasix due to concerns of volume overload but the following day there was signs of dehydration therefore she was given IV fluids.  Basic work-up including infection UDS was negative.  She was seen by physical therapy.  Stable for discharge today.  I suspect this episode could have been secondary to possible hypoperfusion to her brain.  Would allow some sort of permissive hypertension.  Had extensive discussion with the patient's family-they do not wish to be aggressive with care and patient is in agreement with this.  Given her age and comorbidities they would like for her to go back to her pre-existing living condition.   Discharge Diagnoses:  Active Problems:   Mood disorder (HCC)   Essential hypertension, benign   History of TIA (transient ischemic attack)   Chronic renal disease, stage III   Unresponsive episode   Abnormal chest x-ray with multiple nodules   Moderate aortic stenosis   TIA (transient ischemic attack)  Unresponsive  episode, resolved -Suspect from hypoperfusion to the brain.  No obvious evidence of infection nor stroke noted.  CT of the head was negative.  UDS negative.  Most of the work-up is unremarkable.  PT prior to discharge to ensure her ambulatory status is similar to preadmission.  Nodular opacities in the lungs -Procalcitonin negative.  Family and patient does not want to work this up given her advanced age and comorbidities.  If they change their mind, they can get CT of the chest. -Received 1 dose of IV Lasix which led to dehydration therefore 5 cc of bolus ordered today to be infused slowly.  Essential hypertension -Resume home meds  CKD stage II -Around baseline.  Moderate aortic stenosis -Could be a factor in her syncope with combination of intravascular volume depletion.  Would avoid further work-up as we would not plan any intervention upon it.  Consultations:  None  Subjective: Laying in the bed, no complaints.  Tells me she feels okay and back to herself.  Typically uses walker to get around.  Also spoke with the patient's son who tells me that patient's health has declined over the course of past several weeks but does not want any aggressive interventions at this time.  Discharge Exam: Vitals:   10/20/19 0422 10/20/19 0859  BP: (!) 171/63 (!) 157/70  Pulse: 88 90  Resp: 17 17  Temp: 98.4 F (36.9 C) 98 F (36.7 C)  SpO2: 96% 96%   Vitals:   10/19/19 2340 10/20/19 0033 10/20/19 0422 10/20/19 0859  BP: (!) 192/82 (!) 180/70 (!) 171/63 (!) 157/70  Pulse: 93  88 90  Resp:  17  17 17   Temp: 98.3 F (36.8 C)  98.4 F (36.9 C) 98 F (36.7 C)  TempSrc: Oral  Oral Oral  SpO2: 100%  96% 96%  Weight:      Height:        General: Pt is alert, awake, not in acute distress, elderly frail. Cardiovascular: RRR, S1/S2 +, no rubs, no gallops Respiratory: CTA bilaterally, no wheezing, no rhonchi Abdominal: Soft, NT, ND, bowel sounds + Extremities: no edema, no  cyanosis  Discharge Instructions   Allergies as of 10/20/2019      Reactions   Esomeprazole Magnesium Other (See Comments)   ABD Pain   Lansoprazole Diarrhea   Lisinopril Cough   Penicillins    Raloxifene Other (See Comments)   Edema   Sulfa Antibiotics       Medication List    TAKE these medications   clopidogrel 75 MG tablet Commonly known as: PLAVIX TAKE ONE TABLET EVERY DAY   hydrALAZINE 10 MG tablet Commonly known as: APRESOLINE TAKE ONE TABLET 3 TIMES DAILY   mirtazapine 15 MG tablet Commonly known as: REMERON TAKE 1/2 TABLET AT BEDTIME   Nyamyc powder Generic drug: nystatin Apply 1 application topically 2 (two) times daily.   pantoprazole 40 MG tablet Commonly known as: PROTONIX TAKE 1 TABLET BY MOUTH DAILY   sennosides-docusate sodium 8.6-50 MG tablet Commonly known as: SENOKOT-S Take 2 tablets by mouth daily.   Vitamin D (Ergocalciferol) 1.25 MG (50000 UT) Caps capsule Commonly known as: DRISDOL Take 50,000 Units by mouth every 30 (thirty) days.      Follow-up Information    Venia Carbon, MD. Schedule an appointment as soon as possible for a visit in 2 week(s).   Specialties: Internal Medicine, Pediatrics Contact information: [place] [address] [phone]          Allergies  Allergen Reactions  . Esomeprazole Magnesium Other (See Comments)    ABD Pain  . Lansoprazole Diarrhea  . Lisinopril Cough  . Penicillins   . Raloxifene Other (See Comments)    Edema  . Sulfa Antibiotics     You were cared for by a hospitalist during your hospital stay. If you have any questions about your discharge medications or the care you received while you were in the hospital after you are discharged, you can call the unit and asked to speak with the hospitalist on call if the hospitalist that took care of you is not available. Once you are discharged, your primary care physician will handle any further medical issues. Please  note that no refills for any discharge medications will be authorized once you are discharged, as it is imperative that you return to your primary care physician (or establish a relationship with a primary care physician if you do not have one) for your aftercare needs so that they can reassess your need for medications and monitor your lab values.   Procedures/Studies: Ct Head Wo Contrast  Result Date: 10/19/2019 CLINICAL DATA:  Altered mental status EXAM: CT HEAD WITHOUT CONTRAST TECHNIQUE: Contiguous axial images were obtained from the base of the skull through the vertex without intravenous contrast. COMPARISON:  06/20/2018 FINDINGS: Brain: No evidence of acute infarction, hemorrhage, hydrocephalus, extra-axial collection or mass lesion/mass effect. Old right basal ganglia infarct. Subcortical white matter and periventricular small vessel ischemic changes. Mild age related atrophy. Vascular: Intracranial atherosclerosis. Skull: Normal. Negative for fracture or focal lesion. Sinuses/Orbits: The visualized paranasal sinuses are essentially clear. The mastoid  air cells are unopacified. Other: None. IMPRESSION: No evidence of acute intracranial abnormality. Atrophy with small vessel ischemic changes and old right thalamic infarct. Electronically Signed   By: Julian Hy M.D.   On: 10/19/2019 09:58   Ct Chest W Contrast  Result Date: 10/19/2019 CLINICAL DATA:  Syncope.  Abnormal findings on chest x-ray. EXAM: CT CHEST WITH CONTRAST TECHNIQUE: Multidetector CT imaging of the chest was performed during intravenous contrast administration. CONTRAST:  <See Chart> OMNIPAQUE IOHEXOL 300 MG/ML  SOLN COMPARISON:  10/15/2017 FINDINGS: Mild motion artifact is present. Cardiovascular: Mild cardiomegaly. Calcification of the mitral valve annulus. Calcified plaque over the left main and 3 vessel coronary arteries. Calcified plaque over the thoracic aorta. Thoracic aorta is otherwise normal in caliber. Remaining  vascular structures are unremarkable. Mediastinum/Nodes: 1.1 cm precarinal lymph node. Otherwise, no evidence of mediastinal or hilar adenopathy. Remaining mediastinal structures are unremarkable. Lungs/Pleura: Lungs are adequately inflated. Subtle bibasilar atelectasis. No effusion. Punctate calcified granuloma over the right middle lobe. Stable 4 mm nodule right middle lobe. 5-6 mm nodule (previously 4-5 mm) over the left upper lobe. Mild central vascular crowding. Airways are unremarkable. Upper Abdomen: Calcified plaque over the upper abdomen. Subcentimeter hypodensity over the left lobe of the liver without significant change likely a cyst or hemangioma. Partially visualized exophytic cyst over the upper pole left kidney. Musculoskeletal: Degenerative change of the spine IMPRESSION: 1. No acute cardiopulmonary disease. Minimal bibasilar atelectasis. 2. Stable 4 mm nodule over the right middle lobe. 5-6 mm nodule over the left upper lobe slightly larger (previously 4-5 mm). 1.1 cm precarinal lymph node likely reactive. Recommend follow-up CT 1 year. This recommendation follows the consensus statement: Guidelines for Management of Small Pulmonary Nodules Detected on CT Scans: A Statement from the [place] as published in Radiology 2005; 237:395-400. Online at: https://www.arnold.com/. 3. Aortic Atherosclerosis (ICD10-I70.0). Cardiomegaly and atherosclerotic coronary artery disease. 4. Subcentimeter hypodensity over the left lobe of the liver unchanged and too small to characterize but likely a cyst or hemangioma. Small cyst over the upper pole left kidney. Electronically Signed   By: Marin Olp M.D.   On: 10/19/2019 14:46   Dg Chest Portable 1 View  Result Date: 10/19/2019 CLINICAL DATA:  Altered mental status and confusion. EXAM: PORTABLE CHEST 1 VIEW COMPARISON:  06/20/2018 FINDINGS: Cardiac enlargement is stable. Aortic atherosclerosis. There is diffuse pulmonary  vascular congestion identified bilaterally. Bilateral patchy nodular densities are scattered throughout both lungs, nonspecific. This is new when compared with the previous exam. Visualized osseous structures are unremarkable. IMPRESSION: 1. Cardiac enlargement and pulmonary vascular congestion. 2. Bilateral patchy nodular densities are scattered throughout both lungs, new from the previous exam and may represent areas of multifocal infection or metastatic disease. Electronically Signed   By: Kerby Moors M.D.   On: 10/19/2019 10:32      The results of significant diagnostics from this hospitalization (including imaging, microbiology, ancillary and laboratory) are listed below for reference.     Microbiology: Recent Results (from the past 240 hour(s))  SARS CORONAVIRUS 2 (TAT 6-24 HRS) Nasopharyngeal Nasopharyngeal Swab     Status: None   Collection Time: 10/19/19 10:17 AM   Specimen: Nasopharyngeal Swab  Result Value Ref Range Status   SARS Coronavirus 2 NEGATIVE NEGATIVE Final    Comment: (NOTE) SARS-CoV-2 target nucleic acids are NOT DETECTED. The SARS-CoV-2 RNA is generally detectable in upper and lower respiratory specimens during the acute phase of infection. Negative results do not preclude SARS-CoV-2 infection, do not rule out  co-infections with other pathogens, and should not be used as the sole basis for treatment or other patient management decisions. Negative results must be combined with clinical observations, patient history, and epidemiological information. The expected result is Negative. Fact Sheet for Patients: SugarRoll.be Fact Sheet for Healthcare Providers: https://www.woods-mathews.com/ This test is not yet approved or cleared by the [place] FDA and  has been authorized for detection and/or diagnosis of SARS-CoV-2 by FDA under an Emergency Use Authorization (EUA). This EUA will remain  in effect (meaning this test  can be used) for the duration of the COVID-19 declaration under Section 56 4(b)(1) of the Act, 21 U.S.C. section 360bbb-3(b)(1), unless the authorization is terminated or revoked sooner. Performed at [facility], [place] [address] [place], [place] [address]      Labs: BNP (last 3 results) Recent Labs    10/19/19 0936  BNP [id number]*   Basic Metabolic Panel: Recent Labs  Lab 10/19/19 1017  NA 140  K 3.2*  CL 101  CO2 27  GLUCOSE 104*  BUN 12  CREATININE 0.78  CALCIUM 9.2   Liver Function Tests: Recent Labs  Lab 10/19/19 1017  AST 17  ALT 13  ALKPHOS 69  BILITOT 0.7  PROT 6.9  ALBUMIN 3.4*   No results for input(s): LIPASE, AMYLASE in the last 168 hours. No results for input(s): AMMONIA in the last 168 hours. CBC: Recent Labs  Lab 10/19/19 0936  WBC 7.4  NEUTROABS 4.9  HGB 12.0  HCT 36.5  MCV 93.6  PLT 241   Cardiac Enzymes: No results for input(s): CKTOTAL, CKMB, CKMBINDEX, TROPONINI in the last 168 hours. BNP: Invalid input(s): POCBNP CBG: No results for input(s): GLUCAP in the last 168 hours. D-Dimer No results for input(s): DDIMER in the last 72 hours. Hgb A1c No results for input(s): HGBA1C in the last 72 hours. Lipid Profile No results for input(s): CHOL, HDL, LDLCALC, TRIG, CHOLHDL, LDLDIRECT in the last 72 hours. Thyroid function studies No results for input(s): TSH, T4TOTAL, T3FREE, THYROIDAB in the last 72 hours.  Invalid input(s): FREET3 Anemia work up No results for input(s): VITAMINB12, FOLATE, FERRITIN, TIBC, IRON, RETICCTPCT in the last 72 hours. Urinalysis    Component Value Date/Time   COLORURINE YELLOW (A) 10/19/2019 1540   APPEARANCEUR CLEAR (A) 10/19/2019 1540   LABSPEC 1.010 10/19/2019 1540   PHURINE 6.0 10/19/2019 1540   GLUCOSEU NEGATIVE 10/19/2019 1540   HGBUR NEGATIVE 10/19/2019 1540   BILIRUBINUR NEGATIVE 10/19/2019 1540   KETONESUR NEGATIVE 10/19/2019 1540   PROTEINUR NEGATIVE 10/19/2019 1540   NITRITE  POSITIVE (A) 10/19/2019 1540   LEUKOCYTESUR NEGATIVE 10/19/2019 1540   Sepsis Labs Invalid input(s): PROCALCITONIN,  WBC,  LACTICIDVEN Microbiology Recent Results (from the past 240 hour(s))  SARS CORONAVIRUS 2 (TAT 6-24 HRS) Nasopharyngeal Nasopharyngeal Swab     Status: None   Collection Time: 10/19/19 10:17 AM   Specimen: Nasopharyngeal Swab  Result Value Ref Range Status   SARS Coronavirus 2 NEGATIVE NEGATIVE Final    Comment: (NOTE) SARS-CoV-2 target nucleic acids are NOT DETECTED. The SARS-CoV-2 RNA is generally detectable in upper and lower respiratory specimens during the acute phase of infection. Negative results do not preclude SARS-CoV-2 infection, do not rule out co-infections with other pathogens, and should not be used as the sole basis for treatment or other patient management decisions. Negative results must be combined with clinical observations, patient history, and epidemiological information. The expected result is Negative. Fact Sheet for Patients: SugarRoll.be Fact Sheet  for Healthcare Providers: https://www.woods-mathews.com/ This test is not yet approved or cleared by the [place] and  has been authorized for detection and/or diagnosis of SARS-CoV-2 by FDA under an Emergency Use Authorization (EUA). This EUA will remain  in effect (meaning this test can be used) for the duration of the COVID-19 declaration under Section 56 4(b)(1) of the Act, 21 U.S.C. section 360bbb-3(b)(1), unless the authorization is terminated or revoked sooner. Performed at [facility], [place] [address] [place], [place] [address]      Time coordinating discharge:  I have spent 35 minutes face to face with the patient and on the ward discussing the patients care, assessment, plan and disposition with other care givers. >50% of the time was devoted counseling the patient about the risks and benefits of treatment/Discharge  disposition and coordinating care.   SIGNED:   Damita Lack, MD  [facility] 10/20/2019, 12:31 PM   If 7PM-7AM, please contact night-coverage

## 2019-10-20 NOTE — TOC Initial Note (Signed)
Transition of Care [facility]) - Initial/Assessment Note    Patient Details  Name: Debra Ho MRN: [id number] Date of Birth: 09-05-1915  [facility]) CM/SW Contact:    Shelbie Hutching, RN Phone Number: 10/20/2019, 9:53 AM  Clinical Narrative:                  Patient is from [facility].  Per patient's son Debra Ho patient can walk with a walker at the facility and has been getting OT.  RNCM has left a message with the DON at [facility] to see about transportation at discharge.   Expected Discharge Plan: Assisted Living Barriers to Discharge: No Barriers Identified   Patient Goals and CMS Choice        Expected Discharge Plan and Services Expected Discharge Plan: Assisted Living   Discharge Planning Services: CM Consult   Living arrangements for the past 2 months: Assisted Living Facility                                      Prior Living Arrangements/Services Living arrangements for the past 2 months: [place] Lives with:: Facility Resident Patient language and need for interpreter reviewed:: No Do you feel safe going back to the place where you live?: Yes      Need for Family Participation in Patient Care: Yes (Comment)(assisted living resident- TIA's) Care giver support system in place?: Yes (comment) Current home services: Home OT Criminal Activity/Legal Involvement Pertinent to Current Situation/Hospitalization: No - Comment as needed  Activities of Daily Living Home Assistive Devices/Equipment: Walker (specify type) ADL Screening (condition at time of admission) Patient's cognitive ability adequate to safely complete daily activities?: Yes Is the patient deaf or have difficulty hearing?: Yes Does the patient have difficulty seeing, even when wearing glasses/contacts?: No Does the patient have difficulty concentrating, remembering, or making decisions?: No Patient able to express need for assistance with  ADLs?: Yes Does the patient have difficulty dressing or bathing?: Yes Independently performs ADLs?: No Communication: Needs assistance Is this a change from baseline?: Pre-admission baseline Dressing (OT): Needs assistance Is this a change from baseline?: Pre-admission baseline Grooming: Needs assistance Is this a change from baseline?: Pre-admission baseline Feeding: Needs assistance Is this a change from baseline?: Pre-admission baseline Bathing: Needs assistance Is this a change from baseline?: Pre-admission baseline Toileting: Needs assistance Is this a change from baseline?: Pre-admission baseline In/Out Bed: Needs assistance Is this a change from baseline?: Pre-admission baseline Walks in Home: Needs assistance Is this a change from baseline?: Pre-admission baseline Does the patient have difficulty walking or climbing stairs?: Yes Weakness of Legs: Both Weakness of Arms/Hands: None  Permission Sought/Granted Permission sought to share information with : Case Manager, Customer service manager, Family Supports Permission granted to share information with : Yes, Verbal Permission Granted     Permission granted to share info w AGENCY: [place]        Emotional Assessment Appearance:: Appears stated age Attitude/Demeanor/Rapport: Engaged Affect (typically observed): Accepting Orientation: : Oriented to Self, Oriented to Place, Oriented to  Time, Oriented to Situation Alcohol / Substance Use: Not Applicable Psych Involvement: No (comment)  Admission diagnosis:  TIA (transient ischemic attack) [G45.9] Multifocal pneumonia [J18.9] Patient Active Problem List   Diagnosis Date Noted  . Unresponsive episode 10/19/2019  . Abnormal chest x-ray with multiple nodules 10/19/2019  . Moderate aortic stenosis 10/19/2019  .  TIA (transient ischemic attack) 10/19/2019  . Constipation 06/04/2019  . Chronic renal disease, stage III 06/04/2019  . History of TIA (transient  ischemic attack) 01/16/2018  . Essential hypertension, benign 11/20/2017  . Mood disorder ([place])   . GERD without esophagitis    PCP:  Venia Carbon, MD Pharmacy:   [facility], [place] [address] [place] [address] Phone: [phone] Fax: [phone]  [place], [place] [address] Phone: [phone] Fax: [phone]     Social Determinants of Health (SDOH) Interventions    Readmission Risk Interventions No flowsheet data found.

## 2019-10-20 NOTE — Plan of Care (Signed)
  Problem: Education: Goal: Knowledge of [place] Education information/materials will improve Outcome: Progressing Goal: Emotional status will improve Outcome: Progressing Goal: Mental status will improve Outcome: Progressing Goal: Verbalization of understanding the information provided will improve Outcome: Progressing   Problem: Activity: Goal: Interest or engagement in activities will improve Outcome: Progressing Goal: Sleeping patterns will improve Outcome: Progressing   Problem: Coping: Goal: Ability to verbalize frustrations and anger appropriately will improve Outcome: Progressing Goal: Ability to demonstrate self-control will improve Outcome: Progressing   Problem: Health Behavior/Discharge Planning: Goal: Identification of resources available to assist in meeting health care needs will improve Outcome: Progressing Goal: Compliance with treatment plan for underlying cause of condition will improve Outcome: Progressing   Problem: Physical Regulation: Goal: Ability to maintain clinical measurements within normal limits will improve Outcome: Progressing   Problem: Safety: Goal: Periods of time without injury will increase Outcome: Progressing   

## 2019-10-21 DIAGNOSIS — N1831 Chronic kidney disease, stage 3a: Secondary | ICD-10-CM

## 2019-10-21 DIAGNOSIS — I10 Essential (primary) hypertension: Secondary | ICD-10-CM

## 2019-10-21 DIAGNOSIS — I350 Nonrheumatic aortic (valve) stenosis: Secondary | ICD-10-CM

## 2019-10-21 DIAGNOSIS — R4189 Other symptoms and signs involving cognitive functions and awareness: Secondary | ICD-10-CM

## 2019-10-21 MED ORDER — CEPHALEXIN 500 MG PO CAPS: 500.0000 mg | ORAL_CAPSULE | Freq: Two times a day (BID) | ORAL | 0 refills | Status: AC

## 2019-10-21 MED ORDER — CEPHALEXIN 500 MG PO CAPS
500.0000 mg | ORAL_CAPSULE | Freq: Two times a day (BID) | ORAL | Status: DC
  Administered 2019-10-21 – 2019-10-22 (×2): 500 mg via ORAL
  Filled 2019-10-21 (×2): qty 1

## 2019-10-21 NOTE — TOC Progression Note (Signed)
Transition of Care [facility]) - Progression Note    Patient Details  Name: Debra Ho MRN: [id number] Date of Birth: 08-30-1915  Transition of Care [facility]) CM/SW Contact  Shelbie Hutching, RN Phone Number: 10/21/2019, 2:28 PM  Clinical Narrative:    Plan will be for patient to discharge to [facility] tomorrow.  Patient is from [facility] but after working with PT patient will benefit from skilled rehab.  Family has been updated on plan of care.  Daughter in law Jackelyn Poling is at the bedside, patient has been mildly confused at times today.     Expected Discharge Plan: [place] Barriers to Discharge: Continued Medical Work up  Expected Discharge Plan and Services Expected Discharge Plan: [place]   Discharge Planning Services: CM Consult Post Acute Care Choice: [place] Living arrangements for the past 2 months: [place]                                       Social Determinants of Health (SDOH) Interventions    Readmission Risk Interventions No flowsheet data found.

## 2019-10-21 NOTE — Progress Notes (Addendum)
Debra Ho    MR#:  [id number]  DATE OF BIRTH:  1915-04-28  DATE OF ADMISSION:  10/19/2019 ADMITTING PHYSICIAN: Annita Brod, MD  DATE OF DISCHARGE: 10/21/2019  PRIMARY CARE PHYSICIAN: Venia Carbon, MD    ADMISSION DIAGNOSIS:  TIA (transient ischemic attack) [G45.9] Multifocal pneumonia [J18.9]  DISCHARGE DIAGNOSIS:  Unresponsive episode--suspect mild dehydration--resolved HTN CKD-Stage II SECONDARY DIAGNOSIS:   Past Medical History:  Diagnosis Date  . GERD without esophagitis   . Hyperlipemia   . Hypertension   . Hyponatremia 10/2017  . Mood disorder ([place])   . Stroke ([place]) 09/21/2019   left sided weakness  . TIA (transient ischemic attack)     HOSPITAL COURSE:   Debra Ho is a 85 y.o. female with medical history significant for being very hard of hearing, hypertension and previous TIAs on Plavix who this morning was found by her skilled nursing facility and unresponsive, reported even to a deep sternal rub.  The rest the patient's vital signs were all reportedly normal.  On the way to the emergency room, patient suddenly woke up.  Unresponsive episode, resolved -Suspect from hypoperfusion to the brain.  No obvious evidence of infection nor stroke noted.  CT of the head was negative.  UDS negative.  Most of the work-up is unremarkable.  PT prior to discharge to ensure her ambulatory status is similar to preadmission--will resume previous therapy at [facility] -UA postive with nititre and bacteria. With pt presentation--will give keflex for 5 days. No fever here  Nodular opacities in the lungs -Procalcitonin negative.  Family and patient does not want to work this up given her advanced age and comorbidities.  If they change their mind, they can get CT of the chest. -Received 1 dose of IV Lasix which led to dehydration therefore 500 cc of bolus was given--pt appears  euvolemic  Essential hypertension -Resume home meds  CKD stage II -at baseline -creat 0.78  Moderate aortic stenosis -Could be a factor in her syncope with combination of intravascular volume depletion.  Would avoid further work-up (per son's request) as we would not plan any intervention for it   D/w son and Dter in law CONSULTS OBTAINED:    DRUG ALLERGIES:   Allergies  Allergen Reactions  . Esomeprazole Magnesium Other (See Comments)    ABD Pain  . Lansoprazole Diarrhea  . Lisinopril Cough  . Penicillins   . Raloxifene Other (See Comments)    Edema  . Sulfa Antibiotics     DISCHARGE MEDICATIONS:   Allergies as of 10/21/2019      Reactions   Esomeprazole Magnesium Other (See Comments)   ABD Pain   Lansoprazole Diarrhea   Lisinopril Cough   Penicillins    Raloxifene Other (See Comments)   Edema   Sulfa Antibiotics       Medication List    TAKE these medications   clopidogrel 75 MG tablet Commonly known as: PLAVIX TAKE ONE TABLET EVERY DAY   hydrALAZINE 10 MG tablet Commonly known as: APRESOLINE TAKE ONE TABLET 3 TIMES DAILY   mirtazapine 15 MG tablet Commonly known as: REMERON TAKE 1/2 TABLET AT BEDTIME   Nyamyc powder Generic drug: nystatin Apply 1 application topically 2 (two) times daily.   pantoprazole 40 MG tablet Commonly known as: PROTONIX TAKE 1 TABLET BY MOUTH DAILY   sennosides-docusate sodium 8.6-50 MG tablet Commonly known as: SENOKOT-S Take 2 tablets by mouth daily.  Vitamin D (Ergocalciferol) 1.25 MG (50000 UT) Caps capsule Commonly known as: DRISDOL Take 50,000 Units by mouth every 30 (thirty) days.       If you experience worsening of your admission symptoms, develop shortness of breath, life threatening emergency, suicidal or homicidal thoughts you must seek medical attention immediately by calling 911 or calling your MD immediately  if symptoms less severe.  You Must read complete instructions/literature along with  all the possible adverse reactions/side effects for all the Medicines you take and that have been prescribed to you. Take any new Medicines after you have completely understood and accept all the possible adverse reactions/side effects.   Please note  You were cared for by a hospitalist during your hospital stay. If you have any questions about your discharge medications or the care you received while you were in the hospital after you are discharged, you can call the unit and asked to speak with the hospitalist on call if the hospitalist that took care of you is not available. Once you are discharged, your primary care physician will handle any further medical issues. Please note that NO REFILLS for any discharge medications will be authorized once you are discharged, as it is imperative that you return to your primary care physician (or establish a relationship with a primary care physician if you do not have one) for your aftercare needs so that they can reassess your need for medications and monitor your lab values. Today   SUBJECTIVE  Hard on hearing No new issues per RN, uneventful nite, pt alert to place and person Debra Ho any dizziness  VITAL SIGNS:  Blood pressure (!) 166/73, pulse 81, temperature 98.3 F (36.8 C), temperature source Oral, resp. rate 17, height 5' (1.524 m), weight 62.6 kg, SpO2 94 %.  I/O:    Intake/Output Summary (Last 24 hours) at 10/21/2019 0750 Last data filed at 10/21/2019 0339 Gross per 24 hour  Intake 2.41 ml  Output 50 ml  Net -47.59 ml    PHYSICAL EXAMINATION:  GENERAL:  85 y.o.-year-old patient lying in the bed with no acute distress. obese EYES: Pupils equal, round, reactive to light and accommodation. No scleral icterus.  HEENT: Head atraumatic, normocephalic. Oropharynx and nasopharynx clear.  NECK:  Supple, no jugular venous distention. No thyroid enlargement, no tenderness.  LUNGS: Normal breath sounds bilaterally, no wheezing, rales,rhonchi or  crepitation. No use of accessory muscles of respiration.  CARDIOVASCULAR: S1, S2 normal. No murmurs, rubs, or gallops.  ABDOMEN: Soft, non-tender, non-distended. Bowel sounds present. No organomegaly or mass.  EXTREMITIES: No pedal edema, cyanosis, or clubbing.  NEUROLOGIC: Cranial nerves II through XII are intact. Muscle strength 4/5 in all extremities. Sensation intact. Gait not checked.  PSYCHIATRIC: The patient is alert and awake, some baseline confusion SKIN: No obvious rash, lesion, or ulcer.   DATA REVIEW:   CBC  Recent Labs  Lab 10/19/19 0936  WBC 7.4  HGB 12.0  HCT 36.5  PLT 241    Chemistries  Recent Labs  Lab 10/19/19 1017  NA 140  K 3.2*  CL 101  CO2 27  GLUCOSE 104*  BUN 12  CREATININE 0.78  CALCIUM 9.2  AST 17  ALT 13  ALKPHOS 69  BILITOT 0.7    Microbiology Results   Recent Results (from the past 240 hour(s))  SARS CORONAVIRUS 2 (TAT 6-24 HRS) Nasopharyngeal Nasopharyngeal Swab     Status: None   Collection Time: 10/19/19 10:17 AM   Specimen: Nasopharyngeal Swab  Result Value  Ref Range Status   SARS Coronavirus 2 NEGATIVE NEGATIVE Final    Comment: (NOTE) SARS-CoV-2 target nucleic acids are NOT DETECTED. The SARS-CoV-2 RNA is generally detectable in upper and lower respiratory specimens during the acute phase of infection. Negative results do not preclude SARS-CoV-2 infection, do not rule out co-infections with other pathogens, and should not be used as the sole basis for treatment or other patient management decisions. Negative results must be combined with clinical observations, patient history, and epidemiological information. The expected result is Negative. Fact Sheet for Patients: SugarRoll.be Fact Sheet for Healthcare Providers: https://www.woods-mathews.com/ This test is not yet approved or cleared by the [place] FDA and  has been authorized for detection and/or diagnosis of SARS-CoV-2  by FDA under an Emergency Use Authorization (EUA). This EUA will remain  in effect (meaning this test can be used) for the duration of the COVID-19 declaration under Section 56 4(b)(1) of the Act, 21 U.S.C. section 360bbb-3(b)(1), unless the authorization is terminated or revoked sooner. Performed at [facility], [place] [address] [place], [place] [address]     RADIOLOGY:  Ct Head Wo Contrast  Result Date: 10/19/2019 CLINICAL DATA:  Altered mental status EXAM: CT HEAD WITHOUT CONTRAST TECHNIQUE: Contiguous axial images were obtained from the base of the skull through the vertex without intravenous contrast. COMPARISON:  06/20/2018 FINDINGS: Brain: No evidence of acute infarction, hemorrhage, hydrocephalus, extra-axial collection or mass lesion/mass effect. Old right basal ganglia infarct. Subcortical white matter and periventricular small vessel ischemic changes. Mild age related atrophy. Vascular: Intracranial atherosclerosis. Skull: Normal. Negative for fracture or focal lesion. Sinuses/Orbits: The visualized paranasal sinuses are essentially clear. The mastoid air cells are unopacified. Other: None. IMPRESSION: No evidence of acute intracranial abnormality. Atrophy with small vessel ischemic changes and old right thalamic infarct. Electronically Signed   By: Julian Hy M.D.   On: 10/19/2019 09:58   Ct Chest W Contrast  Result Date: 10/19/2019 CLINICAL DATA:  Syncope.  Abnormal findings on chest x-ray. EXAM: CT CHEST WITH CONTRAST TECHNIQUE: Multidetector CT imaging of the chest was performed during intravenous contrast administration. CONTRAST:  <See Chart> OMNIPAQUE IOHEXOL 300 MG/ML  SOLN COMPARISON:  10/15/2017 FINDINGS: Mild motion artifact is present. Cardiovascular: Mild cardiomegaly. Calcification of the mitral valve annulus. Calcified plaque over the left main and 3 vessel coronary arteries. Calcified plaque over the thoracic aorta. Thoracic aorta is otherwise normal in  caliber. Remaining vascular structures are unremarkable. Mediastinum/Nodes: 1.1 cm precarinal lymph node. Otherwise, no evidence of mediastinal or hilar adenopathy. Remaining mediastinal structures are unremarkable. Lungs/Pleura: Lungs are adequately inflated. Subtle bibasilar atelectasis. No effusion. Punctate calcified granuloma over the right middle lobe. Stable 4 mm nodule right middle lobe. 5-6 mm nodule (previously 4-5 mm) over the left upper lobe. Mild central vascular crowding. Airways are unremarkable. Upper Abdomen: Calcified plaque over the upper abdomen. Subcentimeter hypodensity over the left lobe of the liver without significant change likely a cyst or hemangioma. Partially visualized exophytic cyst over the upper pole left kidney. Musculoskeletal: Degenerative change of the spine IMPRESSION: 1. No acute cardiopulmonary disease. Minimal bibasilar atelectasis. 2. Stable 4 mm nodule over the right middle lobe. 5-6 mm nodule over the left upper lobe slightly larger (previously 4-5 mm). 1.1 cm precarinal lymph node likely reactive. Recommend follow-up CT 1 year. This recommendation follows the consensus statement: Guidelines for Management of Small Pulmonary Nodules Detected on CT Scans: A Statement from the [place] as published in Radiology 2005; 237:395-400. Online at: https://www.arnold.com/. 3.  Aortic Atherosclerosis (ICD10-I70.0). Cardiomegaly and atherosclerotic coronary artery disease. 4. Subcentimeter hypodensity over the left lobe of the liver unchanged and too small to characterize but likely a cyst or hemangioma. Small cyst over the upper pole left kidney. Electronically Signed   By: Marin Olp M.D.   On: 10/19/2019 14:46   Dg Chest Portable 1 View  Result Date: 10/19/2019 CLINICAL DATA:  Altered mental status and confusion. EXAM: PORTABLE CHEST 1 VIEW COMPARISON:  06/20/2018 FINDINGS: Cardiac enlargement is stable. Aortic atherosclerosis. There  is diffuse pulmonary vascular congestion identified bilaterally. Bilateral patchy nodular densities are scattered throughout both lungs, nonspecific. This is new when compared with the previous exam. Visualized osseous structures are unremarkable. IMPRESSION: 1. Cardiac enlargement and pulmonary vascular congestion. 2. Bilateral patchy nodular densities are scattered throughout both lungs, new from the previous exam and may represent areas of multifocal infection or metastatic disease. Electronically Signed   By: Kerby Moors M.D.   On: 10/19/2019 10:32     CODE STATUS:     Code Status Orders  (From admission, onward)         Start     Ordered   10/20/19 0034  Do not attempt resuscitation (DNR)  Continuous    Question Answer Comment  In the event of cardiac or respiratory ARREST Do not call a "code blue"   In the event of cardiac or respiratory ARREST Do not perform Intubation, CPR, defibrillation or ACLS   In the event of cardiac or respiratory ARREST Use medication by any route, position, wound care, and other measures to relive pain and suffering. May use oxygen, suction and manual treatment of airway obstruction as needed for comfort.      10/20/19 0033        Code Status History    Date Active Date Inactive Code Status Order ID Comments User Context   06/20/2018 0349 06/20/2018 2207 DNR [id number]  Arta Silence, MD Inpatient   06/20/2018 0327 06/20/2018 0349 Full Code [id number]  Arta Silence, MD Inpatient   10/13/2017 1126 10/16/2017 1903 DNR [id number]  Prince Solian, RN Inpatient   10/13/2017 0918 10/13/2017 1126 Full Code [id number]  Fritzi Mandes, MD ED   Advance Care Planning Activity    Advance Directive Documentation     Most Recent Value  Type of Advance Directive  Out of facility DNR (pink MOST or yellow form)  Pre-existing out of facility DNR order (yellow form or pink MOST form)  Yellow form placed in chart (order not valid for inpatient use)  "MOST" Form in  Place?  -      TOTAL TIME TAKING CARE OF THIS PATIENT: *40* minutes.    Fritzi Mandes M.D on 10/21/2019 at 7:50 AM  Between 7am to 6pm - Pager - [phone] After 6pm go to [email or web address] - password [email or web address]  [facility]    CC: Primary care physician; Venia Carbon, MD   Patient ID: Gari Crown, female   DOB: 08-Feb-1915, 85 y.o.   MRN: [id number]

## 2019-10-21 NOTE — Progress Notes (Signed)
Initial Nutrition Assessment  DOCUMENTATION CODES:   Not applicable  INTERVENTION:  Provide Magic cup TID with meals, each supplement provides 290 kcal and 9 grams of protein.  NUTRITION DIAGNOSIS:   Inadequate oral intake related to decreased appetite as evidenced by per patient/family report.  GOAL:   Patient will meet greater than or equal to 90% of their needs  MONITOR:   PO intake, Supplement acceptance, Labs, Weight trends, I & O's  REASON FOR ASSESSMENT:   Malnutrition Screening Tool    ASSESSMENT:   85 year old female with PMHx of HTN, hx TIA, GERD, HLD, hyponatremia, hx CVA, CKD stage II admitted after unresponsive episode, also with nodular opacities in the lungs.   Met with patient and her daughter-in-law at bedside. Patient is hard of hearing and also confused today. Daughter-in-law was not present for breakfast this morning but per chart she ate 40% of her breakfast this morning. She was too full to eat any lunch. She is ordering dinner tonight. Daughter-in-law reports she needs her meats chopped with gravy but that she will call the kitchen and tell them that for each meal and does not want diet downgraded yet. Daughter-in-law reports that the facility patient lives at has reported patient has been eating less at her meals lately but she is unsure of specifics. She does report patient has a sweet tooth and will usually eat the dessert on her tray and any sweet snacks that are served. Patient adamantly refuses to try any Ensure even though she reports she has never tried any before. She is willing to try [employer].  Patient is unsure of her weight trend. Per chart she was 66.9 kg on 06/04/2019. She is now 62.6 kg (138.01 lbs). She has lost 4.3 kg (6.4% body weight) over the past 5 months, which is not significant for time frame.  Medications reviewed and include: Remeron 7.5 mg QHS, pantoprazole.  Labs reviewed: Potassium 3.2.  Patient is at risk for  malnutrition.  NUTRITION - FOCUSED PHYSICAL EXAM:    Most Recent Value  Orbital Region  No depletion  Upper Arm Region  Mild depletion  Thoracic and Lumbar Region  No depletion  Buccal Region  No depletion  Temple Region  Moderate depletion  Clavicle Bone Region  Moderate depletion  Clavicle and Acromion Bone Region  Moderate depletion  Scapular Bone Region  Moderate depletion  Dorsal Hand  Mild depletion  Patellar Region  Moderate depletion  Anterior Thigh Region  Moderate depletion  Posterior Calf Region  Moderate depletion  Edema (RD Assessment)  None  Hair  Reviewed  Eyes  Reviewed  Mouth  Unable to assess  Skin  Reviewed  Nails  Reviewed     Diet Order:   Diet Order            Diet regular Room service appropriate? Yes; Fluid consistency: Thin  Diet effective now             EDUCATION NEEDS:   No education needs have been identified at this time  Skin:  Skin Assessment: Skin Integrity Issues:(MSAD to breasts)  Last BM:  10/21/2019 - medium type 4  Height:   Ht Readings from Last 1 Encounters:  10/19/19 5' (1.524 m)   Weight:   Wt Readings from Last 1 Encounters:  10/19/19 62.6 kg   Ideal Body Weight:  45.5 kg  BMI:  Body mass index is 26.95 kg/m.  Estimated Nutritional Needs:   Kcal:  1500-1700  Protein:  75-85 grams  Fluid:  1.5-1.7 L/day  Jacklynn Barnacle, MS, RD, LDN Office: [phone] Pager: [phone] After Hours/Weekend Pager: [phone]

## 2019-10-22 NOTE — Discharge Summary (Signed)
[place] [facility] [place] NAME: Debra Ho    MR#:  [id number]  DATE OF BIRTH:  03/05/1915  DATE OF ADMISSION:  10/19/2019 ADMITTING PHYSICIAN: Debra Brod, MD  DATE OF DISCHARGE: 10/22/2019  PRIMARY CARE PHYSICIAN: Debra Carbon, MD    ADMISSION DIAGNOSIS:  TIA (transient ischemic attack) [G45.9] Multifocal pneumonia [J18.9]  DISCHARGE DIAGNOSIS:  Unresponsiveness--resolved due to clinical dehydration HTN CKD-II SECONDARY DIAGNOSIS:   Past Medical History:  Diagnosis Date  . GERD without esophagitis   . Hyperlipemia   . Hypertension   . Hyponatremia 10/2017  . Mood disorder (Debra Ho)   . Stroke (Debra Ho) 09/21/2019   left sided weakness  . TIA (transient ischemic attack)     HOSPITAL COURSE:  Debra Ho a 85 y.o.femalewith medical history significant forbeing very hard of hearing, hypertension and previous TIAs on Plavix who this morning was found by her skilled nursing facility and unresponsive, reported even to a deep sternal rub. The rest the patient's vital signs were all reportedly normal. On the way to the emergency room, patient suddenly woke up.  Unresponsive episode, resolved -Suspect from hypoperfusion to the brain/mild dehydration. No obvious evidence of infection nor stroke noted. CT of the head was negative. UDS negative. Most of the work-up is unremarkable. PT prior to discharge to ensure her ambulatory status is similar to preadmission--will resume previous therapy at [facility] -UA postive with nititre and bacteria. With pt presentation--will give keflex for 5 days. No fever here -pt needs to be encouraged to drink water.  Nodular opacities in the lungs -Procalcitonin negative. Family and patient does not want to work this up given her advanced age and comorbidities. If they change their mind, they can get CT of the chest.  Essential hypertension -Resume home meds  CKD stage  II -at baseline -creat 0.78 -good UOP  Moderate aortic stenosis -Could be a factor in her syncope with combination of intravascular volume depletion. Would avoid further work-up (per son's request) as we would not plan any intervention for it  She will d/c to [facility] today. CM has informed family CONSULTS OBTAINED:    DRUG ALLERGIES:   Allergies  Allergen Reactions  . Esomeprazole Magnesium Other (See Comments)    ABD Pain  . Lansoprazole Diarrhea  . Lisinopril Cough  . Penicillins   . Raloxifene Other (See Comments)    Edema  . Sulfa Antibiotics     DISCHARGE MEDICATIONS:   Allergies as of 10/22/2019      Reactions   Esomeprazole Magnesium Other (See Comments)   ABD Pain   Lansoprazole Diarrhea   Lisinopril Cough   Penicillins    Raloxifene Other (See Comments)   Edema   Sulfa Antibiotics       Medication List    TAKE these medications   cephALEXin 500 MG capsule Commonly known as: KEFLEX Take 1 capsule (500 mg total) by mouth every 12 (twelve) hours for 5 days.   clopidogrel 75 MG tablet Commonly known as: PLAVIX TAKE ONE TABLET EVERY DAY   hydrALAZINE 10 MG tablet Commonly known as: APRESOLINE TAKE ONE TABLET 3 TIMES DAILY   mirtazapine 15 MG tablet Commonly known as: REMERON TAKE 1/2 TABLET AT BEDTIME   Nyamyc powder Generic drug: nystatin Apply 1 application topically 2 (two) times daily.   pantoprazole 40 MG tablet Commonly known as: PROTONIX TAKE 1 TABLET BY MOUTH DAILY   sennosides-docusate sodium 8.6-50 MG tablet Commonly known as:  SENOKOT-S Take 2 tablets by mouth daily.   Vitamin D (Ergocalciferol) 1.25 MG (50000 UT) Caps capsule Commonly known as: DRISDOL Take 50,000 Units by mouth every 30 (thirty) days.       If you experience worsening of your admission symptoms, develop shortness of breath, life threatening emergency, suicidal or homicidal thoughts you must seek medical attention immediately by calling 911 or  calling your MD immediately  if symptoms less severe.  You Must read complete instructions/literature along with all the possible adverse reactions/side effects for all the Medicines you take and that have been prescribed to you. Take any new Medicines after you have completely understood and accept all the possible adverse reactions/side effects.   Please note  You were cared for by a hospitalist during your hospital stay. If you have any questions about your discharge medications or the care you received while you were in the hospital after you are discharged, you can call the unit and asked to speak with the hospitalist on call if the hospitalist that took care of you is not available. Once you are discharged, your primary care physician will handle any further medical issues. Please note that NO REFILLS for any discharge medications will be authorized once you are discharged, as it is imperative that you return to your primary care physician (or establish a relationship with a primary care physician if you do not have one) for your aftercare needs so that they can reassess your need for medications and monitor your lab values. Today   SUBJECTIVE  Overall better Some baseline confusion   VITAL SIGNS:  Blood pressure (!) 148/75, pulse 85, temperature 98.8 F (37.1 C), temperature source Oral, resp. rate 17, height 5' (1.524 m), weight 62.6 kg, SpO2 92 %.  I/O:    Intake/Output Summary (Last 24 hours) at 10/22/2019 1051 Last data filed at 10/22/2019 0939 Gross per 24 hour  Intake 152 ml  Output 1700 ml  Net -1548 ml    PHYSICAL EXAMINATION:  GENERAL:  85 y.o.-year-old patient lying in the bed with no acute distress.  EYES: Pupils equal, round, reactive to light and accommodation. No scleral icterus. Extraocular muscles intact.  HEENT: Head atraumatic, normocephalic. Oropharynx and nasopharynx clear.  NECK:  Supple, no jugular venous distention. No thyroid enlargement, no tenderness.   LUNGS: Normal breath sounds bilaterally, no wheezing, rales,rhonchi or crepitation. No use of accessory muscles of respiration.  CARDIOVASCULAR: S1, S2 normal. No murmurs, rubs, or gallops.  ABDOMEN: Soft, non-tender, non-distended. Bowel sounds present. No organomegaly or mass.  EXTREMITIES: No pedal edema, cyanosis, or clubbing.  NEUROLOGIC: Cranial nerves II through XII are intact. Muscle strength 5/5 in all extremities. Moves all extremities well PSYCHIATRIC: The patient is alert SKIN: No obvious rash, lesion, or ulcer.   DATA REVIEW:   CBC  Recent Labs  Lab 10/19/19 0936  WBC 7.4  HGB 12.0  HCT 36.5  PLT 241    Chemistries  Recent Labs  Lab 10/19/19 1017  NA 140  K 3.2*  CL 101  CO2 27  GLUCOSE 104*  BUN 12  CREATININE 0.78  CALCIUM 9.2  AST 17  ALT 13  ALKPHOS 69  BILITOT 0.7    Microbiology Results   Recent Results (from the past 240 hour(s))  SARS CORONAVIRUS 2 (TAT 6-24 HRS) Nasopharyngeal Nasopharyngeal Swab     Status: None   Collection Time: 10/19/19 10:17 AM   Specimen: Nasopharyngeal Swab  Result Value Ref Range Status   SARS Coronavirus 2  NEGATIVE NEGATIVE Final    Comment: (NOTE) SARS-CoV-2 target nucleic acids are NOT DETECTED. The SARS-CoV-2 RNA is generally detectable in upper and lower respiratory specimens during the acute phase of infection. Negative results do not preclude SARS-CoV-2 infection, do not rule out co-infections with other pathogens, and should not be used as the sole basis for treatment or other patient management decisions. Negative results must be combined with clinical observations, patient history, and epidemiological information. The expected result is Negative. Fact Sheet for Patients: SugarRoll.be Fact Sheet for Healthcare Providers: https://www.woods-mathews.com/ This test is not yet approved or cleared by the [place] FDA and  has been authorized for detection  and/or diagnosis of SARS-CoV-2 by FDA under an Emergency Use Authorization (EUA). This EUA will remain  in effect (meaning this test can be used) for the duration of the COVID-19 declaration under Section 56 4(b)(1) of the Act, 21 U.S.C. section 360bbb-3(b)(1), unless the authorization is terminated or revoked sooner. Performed at [facility], [place] [address], [place], [place] [address]     RADIOLOGY:  No results found.   CODE STATUS:     Code Status Orders  (From admission, onward)         Start     Ordered   10/20/19 0034  Do not attempt resuscitation (DNR)  Continuous    Question Answer Comment  In the event of cardiac or respiratory ARREST Do not call a "code blue"   In the event of cardiac or respiratory ARREST Do not perform Intubation, CPR, defibrillation or ACLS   In the event of cardiac or respiratory ARREST Use medication by any route, position, wound care, and other measures to relive pain and suffering. May use oxygen, suction and manual treatment of airway obstruction as needed for comfort.      10/20/19 0033        Code Status History    Date Active Date Inactive Code Status Order ID Comments User Context   06/20/2018 0349 06/20/2018 2207 DNR [id number]  Arta Silence, MD Inpatient   06/20/2018 0327 06/20/2018 0349 Full Code [id number]  Arta Silence, MD Inpatient   10/13/2017 1126 10/16/2017 1903 DNR [id number]  Prince Solian, RN Inpatient   10/13/2017 0918 10/13/2017 1126 Full Code [id number]  Fritzi Mandes, MD ED   Advance Care Planning Activity    Advance Directive Documentation     Most Recent Value  Type of Advance Directive  Out of facility DNR (pink MOST or yellow form)  Pre-existing out of facility DNR order (yellow form or pink MOST form)  Yellow form placed in chart (order not valid for inpatient use)  "MOST" Form in Place?  -      TOTAL TIME TAKING CARE OF THIS PATIENT: *40* minutes.    Fritzi Mandes M.D on 10/22/2019 at 10:51  AM  Between 7am to 6pm - Pager - [phone] After 6pm go to [email or web address] - password [email or web address]    CC: Primary care physician; Debra Carbon, MD

## 2019-10-22 NOTE — Progress Notes (Signed)
Report called to [facility]. Madlyn Frankel, RN

## 2019-10-22 NOTE — Discharge Instructions (Signed)
Encourage patient to drink fluids during the daytime

## 2019-10-22 NOTE — TOC Transition Note (Signed)
Transition of Care [facility]) - CM/SW Discharge Note   Patient Details  Name: Debra Ho MRN: [id number] Date of Birth: 03-May-1915  [facility] CM/SW Contact:  Shelbie Hutching, RN Phone Number: 10/22/2019, 12:48 PM   Clinical Narrative:    Patient discharging to [facility].  Patient will transport via [employer] cabin crew.  Patient is going to room 302A.  Bedside RN to call report to [phone].  Family updated.    Final next level of care: Skilled Nursing Facility Barriers to Discharge: Barriers Resolved   Patient Goals and CMS Choice   CMS Medicare.gov Compare Post Acute Care list provided to:: Patient Choice offered to / list presented to : Patient  Discharge Placement              Patient [facility] bed at: [facility] Patient to be transferred to facility by:  EMS Name of family member notified: Christella Hartigan Patient and family notified of of transfer: 10/22/19  Discharge Plan and Services   Discharge Planning Services: CM Consult Post Acute Care Choice: Grandfather                               Social Determinants of Health (SDOH) Interventions     Readmission Risk Interventions No flowsheet data found.

## 2019-10-22 NOTE — Care Management Important Message (Signed)
Important Message  Patient Details  Name: Debra Ho MRN: [id number] Date of Birth: 10/10/1915   Medicare Important Message Given:  Yes     Juliann Pulse A Soriya Worster 10/22/2019, 11:20 AM

## 2019-10-22 NOTE — NC FL2 (Signed)
[place] LEVEL OF CARE SCREENING TOOL     IDENTIFICATION  Patient Name: Debra Ho Birthdate: 10-01-1915 Sex: female Admission Date (Current Location): 10/19/2019  [place] and [place] Number:  Engineering geologist and Address:  [facility], [address], [place], [place] [address]      Provider Number: [id number]  Attending Physician Name and Address:  Fritzi Mandes, MD  Relative Name and Phone Number:  Shreya Guse [phone]- daughter in law    Current Level of Care: Hospital Recommended Level of Care: [place] Prior Approval Number:    Date Approved/Denied:   PASRR Number: [id number]  Discharge Plan: SNF    Current Diagnoses: Patient Active Problem List   Diagnosis Date Noted  . Unresponsive episode 10/19/2019  . Abnormal chest x-ray with multiple nodules 10/19/2019  . Moderate aortic stenosis 10/19/2019  . TIA (transient ischemic attack) 10/19/2019  . Constipation 06/04/2019  . Chronic renal disease, stage III 06/04/2019  . History of TIA (transient ischemic attack) 01/16/2018  . Essential hypertension, benign 11/20/2017  . Mood disorder ([facility])   . GERD without esophagitis     Orientation RESPIRATION BLADDER Height & Weight     Self  Normal Continent Weight: 62.6 kg Height:  5' (152.4 cm)  BEHAVIORAL SYMPTOMS/MOOD NEUROLOGICAL BOWEL NUTRITION STATUS      Continent Diet  AMBULATORY STATUS COMMUNICATION OF NEEDS Skin   Supervision Verbally Normal                       Personal Care Assistance Level of Assistance  Bathing, Feeding, Dressing Bathing Assistance: Limited assistance Feeding assistance: Limited assistance Dressing Assistance: Limited assistance     Functional Limitations [place]  PT (By licensed PT), OT (By licensed OT)     PT Frequency: 5 times per week OT Frequency: 5 times per week            Contractures  Contractures Info: Not present    Additional Factors Info  Code Status, Allergies Code Status Info: DNR Allergies Info: Esomeprazole, lansoprazole, lisinopril, pcn, raloxifene, sulfa           Current Medications (10/22/2019):  This is the current hospital active medication list Current Facility-Administered Medications  Medication Dose Route Frequency Provider Last Rate Last Dose  . 0.9 %  sodium chloride infusion   Intravenous PRN Damita Lack, MD 10 mL/hr at 10/20/19 0928 250 mL at 10/20/19 0928  . cephALEXin (KEFLEX) capsule 500 mg  500 mg Oral Q12H Fritzi Mandes, MD   500 mg at 10/22/19 0853  . clopidogrel (PLAVIX) tablet 75 mg  75 mg Oral Daily Annita Brod, MD   75 mg at 10/22/19 0853  . enoxaparin (LOVENOX) injection 30 mg  30 mg Subcutaneous Q24H Annita Brod, MD   30 mg at 10/22/19 0658  . hydrALAZINE (APRESOLINE) tablet 10 mg  10 mg Oral TID Annita Brod, MD   10 mg at 10/22/19 0853  . mirtazapine (REMERON) tablet 7.5 mg  7.5 mg Oral QHS Annita Brod, MD   7.5 mg at 10/21/19 2115  . pantoprazole (PROTONIX) EC tablet 40 mg  40 mg Oral Daily Annita Brod, MD   40 mg at 10/22/19 [id number]     Discharge Medications: Please see discharge summary for a list of discharge medications.  Relevant Imaging Results:  Relevant Lab  Results:   Additional Information SS# [id number]  Shelbie Hutching, RN

## 2019-10-22 NOTE — Progress Notes (Signed)
EMS called for transport. Jaskirat Schwieger S, RN  

## 2019-10-22 NOTE — Progress Notes (Signed)
Patient discharged. Jaymeson Mengel S, RN  

## 2019-10-23 DIAGNOSIS — N182 Chronic kidney disease, stage 2 (mild): Secondary | ICD-10-CM | POA: Diagnosis not present

## 2019-10-23 DIAGNOSIS — Q230 Congenital stenosis of aortic valve: Secondary | ICD-10-CM

## 2019-10-23 DIAGNOSIS — R4020 Unspecified coma: Secondary | ICD-10-CM | POA: Diagnosis not present

## 2019-10-23 DIAGNOSIS — N390 Urinary tract infection, site not specified: Secondary | ICD-10-CM | POA: Diagnosis not present

## 2019-10-23 DIAGNOSIS — I10 Essential (primary) hypertension: Secondary | ICD-10-CM | POA: Diagnosis not present

## 2019-10-23 DIAGNOSIS — R918 Other nonspecific abnormal finding of lung field: Secondary | ICD-10-CM | POA: Diagnosis not present

## 2019-10-26 DIAGNOSIS — F0150 Vascular dementia without behavioral disturbance: Secondary | ICD-10-CM | POA: Diagnosis not present

## 2019-10-26 DIAGNOSIS — K219 Gastro-esophageal reflux disease without esophagitis: Secondary | ICD-10-CM | POA: Diagnosis not present

## 2019-10-26 DIAGNOSIS — G479 Sleep disorder, unspecified: Secondary | ICD-10-CM

## 2019-10-26 DIAGNOSIS — I10 Essential (primary) hypertension: Secondary | ICD-10-CM | POA: Diagnosis not present

## 2019-10-26 DIAGNOSIS — R55 Syncope and collapse: Secondary | ICD-10-CM | POA: Diagnosis not present

## 2019-11-03 DIAGNOSIS — G479 Sleep disorder, unspecified: Secondary | ICD-10-CM | POA: Diagnosis not present

## 2019-11-11 DIAGNOSIS — F0150 Vascular dementia without behavioral disturbance: Secondary | ICD-10-CM | POA: Diagnosis not present

## 2019-11-11 DIAGNOSIS — I10 Essential (primary) hypertension: Secondary | ICD-10-CM | POA: Diagnosis not present

## 2019-11-11 DIAGNOSIS — Z8673 Personal history of transient ischemic attack (TIA), and cerebral infarction without residual deficits: Secondary | ICD-10-CM | POA: Diagnosis not present

## 2019-11-11 DIAGNOSIS — R4181 Age-related cognitive decline: Secondary | ICD-10-CM

## 2019-11-11 DIAGNOSIS — G479 Sleep disorder, unspecified: Secondary | ICD-10-CM | POA: Diagnosis not present

## 2019-11-11 DIAGNOSIS — K219 Gastro-esophageal reflux disease without esophagitis: Secondary | ICD-10-CM | POA: Diagnosis not present

## 2019-11-17 DEATH — deceased
